# Patient Record
Sex: Female | Born: 1975 | Hispanic: No | Marital: Married | Smoking: Never smoker
Health system: Southern US, Community
[De-identification: ages and names within clinical notes are randomized; demographics above are authoritative.]

## PROBLEM LIST (undated history)

## (undated) ENCOUNTER — Inpatient Hospital Stay (HOSPITAL_COMMUNITY): Payer: Self-pay

## (undated) DIAGNOSIS — F32A Depression, unspecified: Secondary | ICD-10-CM

## (undated) DIAGNOSIS — F329 Major depressive disorder, single episode, unspecified: Secondary | ICD-10-CM

## (undated) DIAGNOSIS — E039 Hypothyroidism, unspecified: Secondary | ICD-10-CM

## (undated) DIAGNOSIS — E785 Hyperlipidemia, unspecified: Secondary | ICD-10-CM

## (undated) DIAGNOSIS — Z3490 Encounter for supervision of normal pregnancy, unspecified, unspecified trimester: Secondary | ICD-10-CM

## (undated) DIAGNOSIS — G43909 Migraine, unspecified, not intractable, without status migrainosus: Secondary | ICD-10-CM

## (undated) DIAGNOSIS — F419 Anxiety disorder, unspecified: Secondary | ICD-10-CM

## (undated) HISTORY — DX: Major depressive disorder, single episode, unspecified: F32.9

## (undated) HISTORY — DX: Anxiety disorder, unspecified: F41.9

## (undated) HISTORY — DX: Depression, unspecified: F32.A

## (undated) HISTORY — DX: Migraine, unspecified, not intractable, without status migrainosus: G43.909

## (undated) HISTORY — DX: Hypothyroidism, unspecified: E03.9

## (undated) HISTORY — DX: Hyperlipidemia, unspecified: E78.5

---

## 2007-04-16 DIAGNOSIS — E039 Hypothyroidism, unspecified: Secondary | ICD-10-CM

## 2007-04-16 HISTORY — DX: Hypothyroidism, unspecified: E03.9

## 2008-08-21 ENCOUNTER — Emergency Department (HOSPITAL_COMMUNITY): Admission: EM | Admit: 2008-08-21 | Discharge: 2008-08-21 | Payer: Self-pay | Admitting: Emergency Medicine

## 2010-07-24 LAB — POCT I-STAT, CHEM 8
HCT: 48 % — ABNORMAL HIGH (ref 36.0–46.0)
Hemoglobin: 16.3 g/dL — ABNORMAL HIGH (ref 12.0–15.0)
Potassium: 4 mEq/L (ref 3.5–5.1)
Sodium: 140 mEq/L (ref 135–145)

## 2010-07-24 LAB — TSH: TSH: 8.668 u[IU]/mL — ABNORMAL HIGH (ref 0.350–4.500)

## 2010-07-24 LAB — POCT PREGNANCY, URINE: Preg Test, Ur: NEGATIVE

## 2011-01-10 ENCOUNTER — Inpatient Hospital Stay (INDEPENDENT_AMBULATORY_CARE_PROVIDER_SITE_OTHER)
Admission: RE | Admit: 2011-01-10 | Discharge: 2011-01-10 | Disposition: A | Discharge status: Home / Self Care | Payer: Self-pay | Source: Ambulatory Visit | Attending: Family Medicine | Admitting: Family Medicine

## 2011-01-10 DIAGNOSIS — S0510XA Contusion of eyeball and orbital tissues, unspecified eye, initial encounter: Secondary | ICD-10-CM

## 2011-01-11 ENCOUNTER — Emergency Department (HOSPITAL_COMMUNITY)
Admission: EM | Admit: 2011-01-11 | Discharge: 2011-01-12 | Disposition: A | Discharge status: Home / Self Care | Payer: Self-pay | Attending: Emergency Medicine | Admitting: Emergency Medicine

## 2011-01-11 DIAGNOSIS — N390 Urinary tract infection, site not specified: Secondary | ICD-10-CM | POA: Insufficient documentation

## 2011-01-11 DIAGNOSIS — R300 Dysuria: Secondary | ICD-10-CM | POA: Insufficient documentation

## 2011-01-11 DIAGNOSIS — Z79899 Other long term (current) drug therapy: Secondary | ICD-10-CM | POA: Insufficient documentation

## 2011-01-11 LAB — URINALYSIS, ROUTINE W REFLEX MICROSCOPIC
Protein, ur: NEGATIVE mg/dL
Urobilinogen, UA: 0.2 mg/dL (ref 0.0–1.0)

## 2011-01-11 LAB — URINE MICROSCOPIC-ADD ON

## 2011-01-11 LAB — POCT PREGNANCY, URINE: Preg Test, Ur: NEGATIVE

## 2011-01-13 ENCOUNTER — Emergency Department (HOSPITAL_COMMUNITY)
Admission: EM | Admit: 2011-01-13 | Discharge: 2011-01-13 | Disposition: A | Discharge status: Home / Self Care | Payer: Self-pay | Attending: Emergency Medicine | Admitting: Emergency Medicine

## 2011-01-13 DIAGNOSIS — S139XXA Sprain of joints and ligaments of unspecified parts of neck, initial encounter: Secondary | ICD-10-CM | POA: Insufficient documentation

## 2011-01-13 DIAGNOSIS — R51 Headache: Secondary | ICD-10-CM | POA: Insufficient documentation

## 2011-01-13 DIAGNOSIS — E039 Hypothyroidism, unspecified: Secondary | ICD-10-CM | POA: Insufficient documentation

## 2011-01-13 DIAGNOSIS — Z79899 Other long term (current) drug therapy: Secondary | ICD-10-CM | POA: Insufficient documentation

## 2011-01-13 DIAGNOSIS — X58XXXA Exposure to other specified factors, initial encounter: Secondary | ICD-10-CM | POA: Insufficient documentation

## 2011-01-13 DIAGNOSIS — I10 Essential (primary) hypertension: Secondary | ICD-10-CM | POA: Insufficient documentation

## 2011-01-13 DIAGNOSIS — M542 Cervicalgia: Secondary | ICD-10-CM | POA: Insufficient documentation

## 2011-01-14 LAB — URINE CULTURE

## 2011-05-06 ENCOUNTER — Encounter (HOSPITAL_COMMUNITY): Payer: Self-pay | Admitting: *Deleted

## 2011-05-06 ENCOUNTER — Emergency Department (INDEPENDENT_AMBULATORY_CARE_PROVIDER_SITE_OTHER)
Admission: EM | Admit: 2011-05-06 | Discharge: 2011-05-06 | Disposition: A | Discharge status: Home / Self Care | Payer: Self-pay | Source: Home / Self Care | Attending: Family Medicine | Admitting: Family Medicine

## 2011-05-06 DIAGNOSIS — M940 Chondrocostal junction syndrome [Tietze]: Secondary | ICD-10-CM

## 2011-05-06 NOTE — ED Notes (Signed)
PT  REPORTS  PAIN  ABOVE      THE   L   BREAST     SHE  REPORTS  IT IS  TENDER  TO PALPATION  SHE  DENYS  ANY   DISCHARGE  FROM   HER  NIPPLES    SHE  DENYS   ANY  HISTORY OF  SIMILAR  EPISODES
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
## 2011-05-07 NOTE — ED Provider Notes (Signed)
Medical screening examination/treatment/procedure(s) were performed by non-physician practitioner and as supervising physician I was immediately available for consultation/collaboration.   Alyssa Mancera DOUGLAS MD.    Darryon Bastin Douglas Quantel Mcinturff, MD 05/07/11 0820 

## 2011-07-17 ENCOUNTER — Encounter (HOSPITAL_COMMUNITY): Payer: Self-pay | Admitting: *Deleted

## 2011-07-17 ENCOUNTER — Emergency Department (HOSPITAL_COMMUNITY)
Admission: EM | Admit: 2011-07-17 | Discharge: 2011-07-17 | Disposition: A | Discharge status: Home / Self Care | Payer: Medicaid Other | Attending: Emergency Medicine | Admitting: Emergency Medicine

## 2011-07-17 DIAGNOSIS — M79646 Pain in unspecified finger(s): Secondary | ICD-10-CM

## 2011-07-17 DIAGNOSIS — W4909XA Other specified item causing external constriction, initial encounter: Secondary | ICD-10-CM | POA: Insufficient documentation

## 2011-07-17 DIAGNOSIS — S60949A Unspecified superficial injury of unspecified finger, initial encounter: Secondary | ICD-10-CM | POA: Insufficient documentation

## 2011-07-17 NOTE — ED Notes (Signed)
Ring stuck on rt ring finger

## 2011-07-17 NOTE — Discharge Instructions (Signed)
Ibuprofen for pain as needed. Keep your hand elevated, ice it for 20 min at a time several times a day. Follow up with your doctor as needed.

## 2011-07-17 NOTE — ED Provider Notes (Signed)
History     CSN: [id number]  Arrival date & time 07/17/11  1737   First MD Initiated Contact with Patient 07/17/11 1739      Chief Complaint  Patient presents with  . Finger Injury    (Consider location/radiation/quality/duration/timing/severity/associated sxs/prior treatment) Patient is a 36 y.o. female presenting with hand pain. The history is provided by the patient.  Hand Pain This is a new problem. The current episode started today. Associated symptoms include a fever and joint swelling.  Pt states she put a ring on her finger, states she was unable to take if off. States her finger swelled and turned "purple." States ring was on the finger for about 2 hrs. No other complaints.   Past Medical History  Diagnosis Date  . Thyroid disease     Past Surgical History  Procedure Date  . Cesarean section     No family history on file.  History  Substance Use Topics  . Smoking status: Never Smoker   . Smokeless tobacco: Not on file  . Alcohol Use: No    OB History    Grav Para Term Preterm Abortions TAB SAB Ect Mult Living                  Review of Systems  Constitutional: Positive for fever.  HENT: Negative.   Musculoskeletal: Positive for joint swelling.  All other systems reviewed and are negative.    Allergies  Review of patient's allergies indicates no known allergies.  Home Medications   Current Outpatient Rx  Name Route Sig Dispense Refill  . LEVOTHYROXINE SODIUM 75 MCG PO TABS Oral Take 75 mcg by mouth daily.      BP 154/74  Pulse 105  Temp(Src) 97.4 F (36.3 C) (Oral)  Resp 18  Wt 126 lb (57.153 kg)  SpO2 100%  LMP 06/19/2011  Physical Exam  Nursing note and vitals reviewed. Constitutional: She is oriented to person, place, and time. She appears well-developed and well-nourished.  HENT:  Head: Normocephalic.  Cardiovascular: Normal rate, regular rhythm and normal heart sounds.   Pulmonary/Chest: Effort normal and breath sounds normal.  No respiratory distress.  Musculoskeletal:       Right ring finger has some redness over the proximal phalanx where ring was. No significant swelling. Finger is pink, good cap refill to the tip <2sec. Warm. Good sensation. Full rom of entire finger.   Neurological: She is alert and oriented to person, place, and time.  Skin: Skin is warm and dry.  Psychiatric: She has a normal mood and affect.    ED Course  Procedures (including critical care time)  Ring was removed by an ortho tech. Pt's finger appears to be normal. Full ROM of all joints. No swelling, no skin abrasions or cuts. Good cap refill to the tip. Will d/c home.   No diagnosis found.    MDM          Lottie Mussel, PA 07/17/11 1851

## 2011-07-17 NOTE — ED Provider Notes (Signed)
Medical screening examination/treatment/procedure(s) were performed by non-physician practitioner and as supervising physician I was immediately available for consultation/collaboration.   Dayton Bailiff, MD 07/17/11 2100

## 2011-08-04 ENCOUNTER — Emergency Department (HOSPITAL_COMMUNITY): Payer: Medicaid Other

## 2011-08-04 ENCOUNTER — Encounter (HOSPITAL_COMMUNITY): Payer: Self-pay | Admitting: *Deleted

## 2011-08-04 ENCOUNTER — Emergency Department (HOSPITAL_COMMUNITY)
Admission: EM | Admit: 2011-08-04 | Discharge: 2011-08-04 | Disposition: A | Discharge status: Home / Self Care | Payer: Medicaid Other | Attending: Emergency Medicine | Admitting: Emergency Medicine

## 2011-08-04 DIAGNOSIS — Z3201 Encounter for pregnancy test, result positive: Secondary | ICD-10-CM | POA: Insufficient documentation

## 2011-08-04 DIAGNOSIS — M545 Low back pain, unspecified: Secondary | ICD-10-CM | POA: Insufficient documentation

## 2011-08-04 DIAGNOSIS — O2690 Pregnancy related conditions, unspecified, unspecified trimester: Secondary | ICD-10-CM

## 2011-08-04 DIAGNOSIS — R109 Unspecified abdominal pain: Secondary | ICD-10-CM | POA: Insufficient documentation

## 2011-08-04 LAB — URINALYSIS, ROUTINE W REFLEX MICROSCOPIC
Hgb urine dipstick: NEGATIVE
Ketones, ur: NEGATIVE mg/dL
Protein, ur: NEGATIVE mg/dL
Urobilinogen, UA: 0.2 mg/dL (ref 0.0–1.0)

## 2011-08-04 LAB — BASIC METABOLIC PANEL
BUN: 8 mg/dL (ref 6–23)
CO2: 24 mEq/L (ref 19–32)
Calcium: 9.3 mg/dL (ref 8.4–10.5)
Chloride: 103 mEq/L (ref 96–112)
Creatinine, Ser: 0.56 mg/dL (ref 0.50–1.10)
GFR calc Af Amer: >90 mL/min (ref >90)
GFR calc non Af Amer: >90 mL/min (ref >90)
Glucose, Bld: 102 mg/dL — ABNORMAL HIGH (ref 70–99)
Potassium: 3.3 mEq/L — ABNORMAL LOW (ref 3.5–5.1)
Sodium: 137 mEq/L (ref 135–145)

## 2011-08-04 LAB — CBC
HCT: 44.2 % (ref 36.0–46.0)
Hemoglobin: 15.5 g/dL — ABNORMAL HIGH (ref 12.0–15.0)
MCH: 30 pg (ref 26.0–34.0)
MCHC: 35.1 g/dL (ref 30.0–36.0)
MCV: 85.7 fL (ref 78.0–100.0)
Platelets: 171 10*3/uL (ref 150–400)
RBC: 5.16 MIL/uL — ABNORMAL HIGH (ref 3.87–5.11)
RDW: 13.8 % (ref 11.5–15.5)
WBC: 8.1 10*3/uL (ref 4.0–10.5)

## 2011-08-04 LAB — HCG, QUANTITATIVE, PREGNANCY: hCG, Beta Chain, Quant, S: 5591 m[IU]/mL — ABNORMAL HIGH (ref <5)

## 2011-08-04 LAB — POCT PREGNANCY, URINE: Preg Test, Ur: POSITIVE — AB

## 2011-08-04 NOTE — ED Provider Notes (Signed)
History     CSN: [id number]  Arrival date & time 08/04/11  1335   First MD Initiated Contact with Patient 08/04/11 1406      Chief Complaint  Patient presents with  . Abdominal Pain    (Consider location/radiation/quality/duration/timing/severity/associated sxs/prior treatment) HPI She presents to emergency department with mild lower pelvic pain.  Says the pain began yesterday.  The patient states that she may be pregnant but pregnancy test at home showed a faint pink line.  She states that her lower back is also having some discomfort as well.  Patient, states she has had some mild cramping, like she may be having a period, but no bleeding vaginally or vaginal discharge.  She denies nausea/vomiting, chest pain, shortness of breath, headache, visual changes, dizziness, syncope, or fevers.  Patient states nothing seems to make the pain better or worse.  She states that the discomfort is very mild.  Patient, states she is very anxious to find out that everything is okay because she said her pregnancy last year and the baby died shortly after birth.  Past Medical History  Diagnosis Date  . Thyroid disease     Past Surgical History  Procedure Date  . Cesarean section     History reviewed. No pertinent family history.  History  Substance Use Topics  . Smoking status: Never Smoker   . Smokeless tobacco: Not on file  . Alcohol Use: No    OB History    Grav Para Term Preterm Abortions TAB SAB Ect Mult Living                  Review of Systems All pertinent positives and negatives reviewed in the history of present illness  Allergies  Review of patient's allergies indicates no known allergies.  Home Medications   Current Outpatient Rx  Name Route Sig Dispense Refill  . LEVOTHYROXINE SODIUM 100 MCG PO TABS Oral Take 100 mcg by mouth daily.      BP 135/71  Pulse 121  Temp 97.4 F (36.3 C)  Resp 20  SpO2 100%  LMP 06/20/2011  Physical Exam Physical Examination:  General appearance - alert, well appearing, and in no distress and oriented to person, place, and time Mental status - alert, oriented to person, place, and time Eyes - pupils equal and reactive, extraocular eye movements intact Ears - bilateral TM's and external ear canals normal Nose - normal and patent, no erythema, discharge or polyps Mouth - mucous membranes moist, pharynx normal without lesions Chest - clear to auscultation, no wheezes, rales or rhonchi, symmetric air entry Heart - normal rate, regular rhythm, normal S1, S2, no murmurs, rubs, clicks or gallops Abdomen - no rebound tenderness noted bowel sounds normal no bladder distension noted SOFT but there is some mild tenderness in the suprapubic region Pelvic - normal external genitalia, vulva, vagina, cervix, uterus and adnexa, exam chaperoned by female tech  ED Course  Procedures (including critical care time)  Labs Reviewed  CBC - Abnormal; Notable for the following:    RBC 5.16 (*)    Hemoglobin 15.5 (*)    All other components within normal limits  BASIC METABOLIC PANEL - Abnormal; Notable for the following:    Potassium 3.3 (*)    Glucose, Bld 102 (*)    All other components within normal limits  POCT PREGNANCY, URINE - Abnormal; Notable for the following:    Preg Test, Ur POSITIVE (*)    All other components within normal limits  HCG, QUANTITATIVE, PREGNANCY - Abnormal; Notable for the following:    hCG, Beta Chain, Quant, S 5591 (*)    All other components within normal limits  URINALYSIS, ROUTINE W REFLEX MICROSCOPIC    Filed Vitals:   08/04/11 1341  BP: 135/71  Pulse: 121  Temp: 97.4 F (36.3 C)  Resp: 20  SpO2: 100%    MDM          Carlyle Dolly, PA-C 08/04/11 1625

## 2011-08-04 NOTE — Discharge Instructions (Signed)
Ultrasound shows that you have a fetus that is in the right location.  No other abnormal finding were noted here today.  Please follow up with an obstetrician at [facility] in the next 7-14 days to have a follow up ultrasound perform and to reevaluate you.  Return to ER if you have any other concerns.    ABCs of Pregnancy A Antepartum care is very important. Be sure you see your doctor and get prenatal care as soon as you think you are pregnant. At this time, you will be tested for infection, genetic abnormalities and potential problems with you and the pregnancy. This is the time to discuss diet, exercise, work, medications, labor, pain medication during labor and the possibility of a cesarean delivery. Ask any questions that may concern you. It is important to see your doctor regularly throughout your pregnancy. Avoid exposure to toxic substances and chemicals - such as cleaning solvents, lead and mercury, some insecticides, and paint. Pregnant women should avoid exposure to paint fumes, and fumes that cause you to feel ill, dizzy or faint. When possible, it is a good idea to have a pre-pregnancy consultation with your caregiver to begin some important recommendations your caregiver suggests such as, taking folic acid, exercising, quitting smoking, avoiding alcoholic beverages, etc. B Breastfeeding is the healthiest choice for both you and your baby. It has many nutritional benefits for the baby and health benefits for the mother. It also creates a very tight and loving bond between the baby and mother. Talk to your doctor, your family and friends, and your employer about how you choose to feed your baby and how they can support you in your decision. Not all birth defects can be prevented, but a woman can take actions that may increase her chance of having a healthy baby. Many birth defects happen very early in pregnancy, sometimes before a woman even knows she is pregnant. Birth defects or abnormalities  of any child in your or the father's family should be discussed with your caregiver. Get a good support bra as your breast size changes. Wear it especially when you exercise and when nursing.  C Celebrate the news of your pregnancy with the your spouse/father and family. Childbirth classes are helpful to take for you and the spouse/father because it helps to understand what happens during the pregnancy, labor and delivery. Cesarean delivery should be discussed with your doctor so you are prepared for that possibility. The pros and cons of circumcision if it is a boy, should be discussed with your pediatrician. Cigarette smoking during pregnancy can result in low birth weight babies. It has been associated with infertility, miscarriages, tubal pregnancies, infant death (mortality) and poor health (morbidity) in childhood. Additionally, cigarette smoking may cause long-term learning disabilities. If you smoke, you should try to quit before getting pregnant and not smoke during the pregnancy. Secondary smoke may also harm a mother and her developing baby. It is a good idea to ask people to stop smoking around you during your pregnancy and after the baby is born. Extra calcium is necessary when you are pregnant and is found in your prenatal vitamin, in dairy products, green leafy vegetables and in calcium supplements. D A healthy diet according to your current weight and height, along with vitamins and mineral supplements should be discussed with your caregiver. Domestic abuse or violence should be made known to your doctor right away to get the situation corrected. Drink more water when you exercise to keep hydrated. Discomfort  of your back and legs usually develops and progresses from the middle of the second trimester through to delivery of the baby. This is because of the enlarging baby and uterus, which may also affect your balance. Do not take illegal drugs. Illegal drugs can seriously harm the baby and you.  Drink extra fluids (water is best) throughout pregnancy to help your body keep up with the increases in your blood volume. Drink at least 6 to 8 glasses of water, fruit juice, or milk each day. A good way to know you are drinking enough fluid is when your urine looks almost like clear water or is very light yellow.  E Eat healthy to get the nutrients you and your unborn baby need. Your meals should include the five basic food groups. Exercise (30 minutes of light to moderate exercise a day) is important and encouraged during pregnancy, if there are no medical problems or problems with the pregnancy. Exercise that causes discomfort or dizziness should be stopped and reported to your caregiver. Emotions during pregnancy can change from being ecstatic to depression and should be understood by you, your partner and your family. F Fetal screening with ultrasound, amniocentesis and monitoring during pregnancy and labor is common and sometimes necessary. Take 400 micrograms of folic acid daily both before, when possible, and during the first few months of pregnancy to reduce the risk of birth defects of the brain and spine. All women who could possibly become pregnant should take a vitamin with folic acid, every day. It is also important to eat a healthy diet with fortified foods (enriched grain products, including cereals, rice, breads, and pastas) and foods with natural sources of folate (orange juice, green leafy vegetables, beans, peanuts, broccoli, asparagus, peas, and lentils). The father should be involved with all aspects of the pregnancy including, the prenatal care, childbirth classes, labor, delivery, and postpartum time. Fathers may also have emotional concerns about being a father, financial needs, and raising a family. G Genetic testing should be done appropriately. It is important to know your family and the father's history. If there have been problems with pregnancies or birth defects in your family,  report these to your doctor. Also, genetic counselors can talk with you about the information you might need in making decisions about having a family. You can call a major medical center in your area for help in finding a board-certified genetic counselor. Genetic testing and counseling should be done before pregnancy when possible, especially if there is a history of problems in the mother's or father's family. Certain ethnic backgrounds are more at risk for genetic defects. H Get familiar with the hospital where you will be having your baby. Get to know how long it takes to get there, the labor and delivery area, and the hospital procedures. Be sure your medical insurance is accepted there. Get your home ready for the baby including, clothes, the baby's room (when possible), furniture and car seat. Hand washing is important throughout the day, especially after handling raw meat and poultry, changing the baby's diaper or using the bathroom. This can help prevent the spread of many bacteria and viruses that cause infection. Your hair may become dry and thinner, but will return to normal a few weeks after the baby is born. Heartburn is a common problem that can be treated by taking antacids recommended by your caregiver, eating smaller meals 5 or 6 times a day, not drinking liquids when eating, drinking between meals and raising the head  of your bed 2 to 3 inches. I Insurance to cover you, the baby, doctor and hospital should be reviewed so that you will be prepared to pay any costs not covered by your insurance plan. If you do not have medical insurance, there are usually clinics and services available for you in your community. Take 30 milligrams of iron during your pregnancy as prescribed by your doctor to reduce the risk of low red blood cells (anemia) later in pregnancy. All women of childbearing age should eat a diet rich in iron. J There should be a joint effort for the mother, father and any other  children to adapt to the pregnancy financially, emotionally, and psychologically during the pregnancy. Join a support group for moms-to-be. Or, join a class on parenting or childbirth. Have the family participate when possible. K Know your limits. Let your caregiver know if you experience any of the following:   Pain of any kind.   Strong cramps.   You develop a lot of weight in a short period of time (5 pounds in 3 to 5 days).   Vaginal bleeding, leaking of amniotic fluid.   Headache, vision problems.   Dizziness, fainting, shortness of breath.   Chest pain.   Fever of 102 F (38.9 C) or higher.   Gush of clear fluid from your vagina.   Painful urination.   Domestic violence.   Irregular heartbeat (palpitations).   Rapid beating of the heart (tachycardia).   Constant feeling sick to your stomach (nauseous) and vomiting.   Trouble walking, fluid retention (edema).   Muscle weakness.   If your baby has decreased activity.   Persistent diarrhea.   Abnormal vaginal discharge.   Uterine contractions at 20-minute intervals.   Back pain that travels down your leg.  L Learn and practice that what you eat and drink should be in moderation and healthy for you and your baby. Legal drugs such as alcohol and caffeine are important issues for pregnant women. There is no safe amount of alcohol a woman can drink while pregnant. Fetal alcohol syndrome, a disorder characterized by growth retardation, facial abnormalities, and central nervous system dysfunction, is caused by a woman's use of alcohol during pregnancy. Caffeine, found in tea, coffee, soft drinks and chocolate, should also be limited. Be sure to read labels when trying to cut down on caffeine during pregnancy. More than 200 foods, beverages, and over-the-counter medications contain caffeine and have a high salt content! There are coffees and teas that do not contain caffeine. M Medical conditions such as diabetes,  epilepsy, and high blood pressure should be treated and kept under control before pregnancy when possible, but especially during pregnancy. Ask your caregiver about any medications that may need to be changed or adjusted during pregnancy. If you are currently taking any medications, ask your caregiver if it is safe to take them while you are pregnant or before getting pregnant when possible. Also, be sure to discuss any herbs or vitamins you are taking. They are medicines, too! Discuss with your doctor all medications, prescribed and over-the-counter, that you are taking. During your prenatal visit, discuss the medications your doctor may give you during labor and delivery. N Never be afraid to ask your doctor or caregiver questions about your health, the progress of the pregnancy, family problems, stressful situations, and recommendation for a pediatrician, if you do not have one. It is better to take all precautions and discuss any questions or concerns you may have during your  office visits. It is a good idea to write down your questions before you visit the doctor. O Over-the-counter cough and cold remedies may contain alcohol or other ingredients that should be avoided during pregnancy. Ask your caregiver about prescription, herbs or over-the-counter medications that you are taking or may consider taking while pregnant.  P Physical activity during pregnancy can benefit both you and your baby by lessening discomfort and fatigue, providing a sense of well-being, and increasing the likelihood of early recovery after delivery. Light to moderate exercise during pregnancy strengthens the belly (abdominal) and back muscles. This helps improve posture. Practicing yoga, walking, swimming, and cycling on a stationary bicycle are usually safe exercises for pregnant women. Avoid scuba diving, exercise at high altitudes (over 3000 feet), skiing, horseback riding, contact sports, etc. Always check with your doctor  before beginning any kind of exercise, especially during pregnancy and especially if you did not exercise before getting pregnant. Q Queasiness, stomach upset and morning sickness are common during pregnancy. Eating a couple of crackers or dry toast before getting out of bed. Foods that you normally love may make you feel sick to your stomach. You may need to substitute other nutritious foods. Eating 5 or 6 small meals a day instead of 3 large ones may make you feel better. Do not drink with your meals, drink between meals. Questions that you have should be written down and asked during your prenatal visits. R Read about and make plans to baby-proof your home. There are important tips for making your home a safer environment for your baby. Review the tips and make your home safer for you and your baby. Read food labels regarding calories, salt and fat content in the food. S Saunas, hot tubs, and steam rooms should be avoided while you are pregnant. Excessive high heat may be harmful during your pregnancy. Your caregiver will screen and examine you for sexually transmitted diseases and genetic disorders during your prenatal visits. Learn the signs of labor. Sexual relations while pregnant is safe unless there is a medical or pregnancy problem and your caregiver advises against it. T Traveling long distances should be avoided especially in the third trimester of your pregnancy. If you do have to travel out of state, be sure to take a copy of your medical records and medical insurance plan with you. You should not travel long distances without seeing your doctor first. Most airlines will not allow you to travel after 36 weeks of pregnancy. Toxoplasmosis is an infection caused by a parasite that can seriously harm an unborn baby. Avoid eating undercooked meat and handling cat litter. Be sure to wear gloves when gardening. Tingling of the hands and fingers is not unusual and is due to fluid retention. This will  go away after the baby is born. U Womb (uterus) size increases during the first trimester. Your kidneys will begin to function more efficiently. This may cause you to feel the need to urinate more often. You may also leak urine when sneezing, coughing or laughing. This is due to the growing uterus pressing against your bladder, which lies directly in front of and slightly under the uterus during the first few months of pregnancy. If you experience burning along with frequency of urination or bloody urine, be sure to tell your doctor. The size of your uterus in the third trimester may cause a problem with your balance. It is advisable to maintain good posture and avoid wearing high heels during this time. An ultrasound  of your baby may be necessary during your pregnancy and is safe for you and your baby. V Vaccinations are an important concern for pregnant women. Get needed vaccines before pregnancy. Center for Disease Control ([employer]) has clear guidelines for the use of vaccines during pregnancy. Review the list, be sure to discuss it with your doctor. Prenatal vitamins are helpful and healthy for you and the baby. Do not take extra vitamins except what is recommended. Taking too much of certain vitamins can cause overdose problems. Continuous vomiting should be reported to your caregiver. Varicose veins may appear especially if there is a family history of varicose veins. They should subside after the delivery of the baby. Support hose helps if there is leg discomfort. W Being overweight or underweight during pregnancy may cause problems. Try to get within 15 pounds of your ideal weight before pregnancy. Remember, pregnancy is not a time to be dieting! Do not stop eating or start skipping meals as your weight increases. Both you and your baby need the calories and nutrition you receive from a healthy diet. Be sure to consult with your doctor about your diet. There is a formula and diet plan available  depending on whether you are overweight or underweight. Your caregiver or nutritionist can help and advise you if necessary. X Avoid X-rays. If you must have dental work or diagnostic tests, tell your dentist or physician that you are pregnant so that extra care can be taken. X-rays should only be taken when the risks of not taking them outweigh the risk of taking them. If needed, only the minimum amount of radiation should be used. When X-rays are necessary, protective lead shields should be used to cover areas of the body that are not being X-rayed. Y Your baby loves you. Breastfeeding your baby creates a loving and very close bond between the two of you. Give your baby a healthy environment to live in while you are pregnant. Infants and children require constant care and guidance. Their health and safety should be carefully watched at all times. After the baby is born, rest or take a nap when the baby is sleeping. Z Get your ZZZs. Be sure to get plenty of rest. Resting on your side as often as possible, especially on your left side is advised. It provides the best circulation to your baby and helps reduce swelling. Try taking a nap for 30 to 45 minutes in the afternoon when possible. After the baby is born rest or take a nap when the baby is sleeping. Try elevating your feet for that amount of time when possible. It helps the circulation in your legs and helps reduce swelling.  Most information courtesy of the CDC. Document Released: 04/01/2005 Document Revised: 03/21/2011 Document Reviewed: 12/14/2008 [facility] Patient Information 2012 [place], [place].

## 2011-08-04 NOTE — ED Provider Notes (Signed)
Assumed care @ 1624.  G3P1 pregnant pt with intermittent suprapubic abd pain requesting further eval.  [place] ordered.  Does have GYN whom she is amendable with plan.  No vaginal bleeding, discharge, and pelvic exam is unremarkable according to [facility], PA-C.  Plan to d/c if [place] is unremarkable.    5:20 PM US shows an intrauterine gestational sac, too early to date.  Recommend f/u US in 7-14 days.  Pt did not have a pelvic exam performed yet.  My pelvic examination were unremarkable. No chanderlier sign, cervical os closed, mild discharge noted.  Culture sent, wet prep sent.    6:20 PM Wet prep is unremarkable.  Will d/c with f/u instruction.  Pt does have appointment for OBGYN.  Pt does have mildly elevated HR due to worries of her fetal's health.  Reassurance given.  Pt much calmer now.    Results for orders placed during the hospital encounter of 08/04/11  URINALYSIS, ROUTINE W REFLEX MICROSCOPIC      Component Value Range   Color, Urine YELLOW  YELLOW    APPearance CLEAR  CLEAR    Specific Gravity, Urine 1.005  1.005 - 1.030    pH 7.0  5.0 - 8.0    Glucose, UA NEGATIVE  NEGATIVE (mg/dL)   Hgb urine dipstick NEGATIVE  NEGATIVE    Bilirubin Urine NEGATIVE  NEGATIVE    Ketones, ur NEGATIVE  NEGATIVE (mg/dL)   Protein, ur NEGATIVE  NEGATIVE (mg/dL)   Urobilinogen, UA 0.2  0.0 - 1.0 (mg/dL)   Nitrite NEGATIVE  NEGATIVE    Leukocytes, UA NEGATIVE  NEGATIVE   CBC      Component Value Range   WBC 8.1  4.0 - 10.5 (K/uL)   RBC 5.16 (*) 3.87 - 5.11 (MIL/uL)   Hemoglobin 15.5 (*) 12.0 - 15.0 (g/dL)   HCT 16.1  09.6 - 04.5 (%)   MCV 85.7  78.0 - 100.0 (fL)   MCH 30.0  26.0 - 34.0 (pg)   MCHC 35.1  30.0 - 36.0 (g/dL)   RDW 40.9  81.1 - 91.4 (%)   Platelets 171  150 - 400 (K/uL)  BASIC METABOLIC PANEL      Component Value Range   Sodium 137  135 - 145 (mEq/L)   Potassium 3.3 (*) 3.5 - 5.1 (mEq/L)   Chloride 103  96 - 112 (mEq/L)   CO2 24  19 - 32 (mEq/L)   Glucose, Bld 102 (*) 70 - 99  (mg/dL)   BUN 8  6 - 23 (mg/dL)   Creatinine, Ser 7.82  0.50 - 1.10 (mg/dL)   Calcium 9.3  8.4 - 95.6 (mg/dL)   GFR calc non Af Amer >90  >90 (mL/min)   GFR calc Af Amer >90  >90 (mL/min)  POCT PREGNANCY, URINE      Component Value Range   Preg Test, Ur POSITIVE (*) NEGATIVE   HCG, QUANTITATIVE, PREGNANCY      Component Value Range   hCG, Beta Chain, Quant, S 5591 (*) <5 (mIU/mL)  WET PREP, GENITAL      Component Value Range   Yeast Wet Prep HPF POC NONE SEEN  NONE SEEN    Trich, Wet Prep NONE SEEN  NONE SEEN    Clue Cells Wet Prep HPF POC NONE SEEN  NONE SEEN    WBC, Wet Prep HPF POC NONE SEEN  NONE SEEN    US Ob Comp Less 14 Wks  08/04/2011  *RADIOLOGY REPORT*  Clinical Data: Pregnancy.  Pain.  Gravida 3, para 1.  Quantitative beta HCG is 5591.  By LMP, the patient is 6 weeks 3 days.  EDC by LMP is 03/26/2012.  OBSTETRIC <14 WK ULTRASOUND  Technique:  Transabdominal ultrasound was performed for evaluation of the gestation as well as the maternal uterus and adnexal regions.  None Intrauterine gestational sac: Present Yolk sac: Present Embryo: Not visualized Cardiac Activity: Not visualized  MSD: 8.0 mm  5w  4d [place] EDC: 04/01/2012  Maternal uterus/Adnexae: The left ovary has a normal appearance.  Right ovary has a normal appearance and contains a corpus luteum cyst.  IMPRESSION:  1.  Intrauterine gestational sac with yolk sac.  No embryo identified at this point. 2.  Follow-up ultrasound is suggested in 10-14 days to assess for presence of fetal pole and for dating purposes.  Original Report Authenticated By: Patterson Hammersmith, M.D.   US Ob Transvaginal  08/04/2011  *RADIOLOGY REPORT*  Clinical Data: Pregnancy.  Pain.  Gravida 3, para 1.  Quantitative beta HCG is 5591.  By LMP, the patient is 6 weeks 3 days.  EDC by LMP is 03/26/2012.  OBSTETRIC <14 WK ULTRASOUND  Technique:  Transabdominal ultrasound was performed for evaluation of the gestation as well as the maternal uterus and adnexal  regions.  None Intrauterine gestational sac: Present Yolk sac: Present Embryo: Not visualized Cardiac Activity: Not visualized  MSD: 8.0 mm  5w  4d [place] EDC: 04/01/2012  Maternal uterus/Adnexae: The left ovary has a normal appearance.  Right ovary has a normal appearance and contains a corpus luteum cyst.  IMPRESSION:  1.  Intrauterine gestational sac with yolk sac.  No embryo identified at this point. 2.  Follow-up ultrasound is suggested in 10-14 days to assess for presence of fetal pole and for dating purposes.  Original Report Authenticated By: Patterson Hammersmith, M.D.      Fayrene Helper, PA-C 08/04/11 1821

## 2011-08-04 NOTE — ED Notes (Signed)
Reports having lower abd pain, cramping and lower back pain, thinks she may be pregnant, lmp 3/7. Denies any vaginal discharge or bleeding.

## 2011-08-05 NOTE — ED Provider Notes (Signed)
Medical screening examination/treatment/procedure(s) were performed by non-physician practitioner and as supervising physician I was immediately available for consultation/collaboration.  Geoffery Lyons, MD 08/05/11 [phone]

## 2011-08-05 NOTE — ED Provider Notes (Signed)
Medical screening examination/treatment/procedure(s) were performed by non-physician practitioner and as supervising physician I was immediately available for consultation/collaboration.   Carleene Cooper III, MD 08/05/11 [phone]

## 2011-08-06 LAB — GC/CHLAMYDIA PROBE AMP, GENITAL
Chlamydia, DNA Probe: NEGATIVE
GC Probe Amp, Genital: NEGATIVE

## 2011-08-14 ENCOUNTER — Encounter: Payer: Self-pay | Admitting: Obstetrics and Gynecology

## 2011-08-15 ENCOUNTER — Encounter: Payer: Self-pay | Admitting: Obstetrics and Gynecology

## 2011-08-15 ENCOUNTER — Ambulatory Visit (INDEPENDENT_AMBULATORY_CARE_PROVIDER_SITE_OTHER): Payer: Self-pay

## 2011-08-15 ENCOUNTER — Ambulatory Visit (INDEPENDENT_AMBULATORY_CARE_PROVIDER_SITE_OTHER): Payer: Medicaid Other | Admitting: Obstetrics and Gynecology

## 2011-08-15 VITALS — BP 120/70 | Ht 59.75 in | Wt 125.0 lb

## 2011-08-15 DIAGNOSIS — O3680X0 Pregnancy with inconclusive fetal viability, not applicable or unspecified: Secondary | ICD-10-CM

## 2011-08-15 DIAGNOSIS — IMO0002 Reserved for concepts with insufficient information to code with codable children: Secondary | ICD-10-CM

## 2011-08-15 DIAGNOSIS — Z3200 Encounter for pregnancy test, result unknown: Secondary | ICD-10-CM

## 2011-08-15 DIAGNOSIS — N912 Amenorrhea, unspecified: Secondary | ICD-10-CM

## 2011-08-15 LAB — US OB LIMITED

## 2011-08-15 MED ORDER — PRENATAL VITAMINS (DIS) PO TABS: 1.0000 | ORAL_TABLET | Freq: Every day | ORAL | Status: DC

## 2011-08-15 NOTE — Patient Instructions (Signed)
ABCs of Pregnancy A Antepartum care is very important. Be sure you see your doctor and get prenatal care as soon as you think you are pregnant. At this time, you will be tested for infection, genetic abnormalities and potential problems with you and the pregnancy. This is the time to discuss diet, exercise, work, medications, labor, pain medication during labor and the possibility of a cesarean delivery. Ask any questions that may concern you. It is important to see your doctor regularly throughout your pregnancy. Avoid exposure to toxic substances and chemicals - such as cleaning solvents, lead and mercury, some insecticides, and paint. Pregnant women should avoid exposure to paint fumes, and fumes that cause you to feel ill, dizzy or faint. When possible, it is a good idea to have a pre-pregnancy consultation with your caregiver to begin some important recommendations your caregiver suggests such as, taking folic acid, exercising, quitting smoking, avoiding alcoholic beverages, etc. B Breastfeeding is the healthiest choice for both you and your baby. It has many nutritional benefits for the baby and health benefits for the mother. It also creates a very tight and loving bond between the baby and mother. Talk to your doctor, your family and friends, and your employer about how you choose to feed your baby and how they can support you in your decision. Not all birth defects can be prevented, but a woman can take actions that may increase her chance of having a healthy baby. Many birth defects happen very early in pregnancy, sometimes before a woman even knows she is pregnant. Birth defects or abnormalities of any child in your or the father's family should be discussed with your caregiver. Get a good support bra as your breast size changes. Wear it especially when you exercise and when nursing.  C Celebrate the news of your pregnancy with the your spouse/father and family. Childbirth classes are helpful to  take for you and the spouse/father because it helps to understand what happens during the pregnancy, labor and delivery. Cesarean delivery should be discussed with your doctor so you are prepared for that possibility. The pros and cons of circumcision if it is a boy, should be discussed with your pediatrician. Cigarette smoking during pregnancy can result in low birth weight babies. It has been associated with infertility, miscarriages, tubal pregnancies, infant death (mortality) and poor health (morbidity) in childhood. Additionally, cigarette smoking may cause long-term learning disabilities. If you smoke, you should try to quit before getting pregnant and not smoke during the pregnancy. Secondary smoke may also harm a mother and her developing baby. It is a good idea to ask people to stop smoking around you during your pregnancy and after the baby is born. Extra calcium is necessary when you are pregnant and is found in your prenatal vitamin, in dairy products, green leafy vegetables and in calcium supplements. D A healthy diet according to your current weight and height, along with vitamins and mineral supplements should be discussed with your caregiver. Domestic abuse or violence should be made known to your doctor right away to get the situation corrected. Drink more water when you exercise to keep hydrated. Discomfort of your back and legs usually develops and progresses from the middle of the second trimester through to delivery of the baby. This is because of the enlarging baby and uterus, which may also affect your balance. Do not take illegal drugs. Illegal drugs can seriously harm the baby and you. Drink extra fluids (water is best) throughout pregnancy to help   your body keep up with the increases in your blood volume. Drink at least 6 to 8 glasses of water, fruit juice, or milk each day. A good way to know you are drinking enough fluid is when your urine looks almost like clear water or is very light  yellow.  E Eat healthy to get the nutrients you and your unborn baby need. Your meals should include the five basic food groups. Exercise (30 minutes of light to moderate exercise a day) is important and encouraged during pregnancy, if there are no medical problems or problems with the pregnancy. Exercise that causes discomfort or dizziness should be stopped and reported to your caregiver. Emotions during pregnancy can change from being ecstatic to depression and should be understood by you, your partner and your family. F Fetal screening with ultrasound, amniocentesis and monitoring during pregnancy and labor is common and sometimes necessary. Take 400 micrograms of folic acid daily both before, when possible, and during the first few months of pregnancy to reduce the risk of birth defects of the brain and spine. All women who could possibly become pregnant should take a vitamin with folic acid, every day. It is also important to eat a healthy diet with fortified foods (enriched grain products, including cereals, rice, breads, and pastas) and foods with natural sources of folate (orange juice, green leafy vegetables, beans, peanuts, broccoli, asparagus, peas, and lentils). The father should be involved with all aspects of the pregnancy including, the prenatal care, childbirth classes, labor, delivery, and postpartum time. Fathers may also have emotional concerns about being a father, financial needs, and raising a family. G Genetic testing should be done appropriately. It is important to know your family and the father's history. If there have been problems with pregnancies or birth defects in your family, report these to your doctor. Also, genetic counselors can talk with you about the information you might need in making decisions about having a family. You can call a major medical center in your area for help in finding a board-certified genetic counselor. Genetic testing and counseling should be done  before pregnancy when possible, especially if there is a history of problems in the mother's or father's family. Certain ethnic backgrounds are more at risk for genetic defects. H Get familiar with the hospital where you will be having your baby. Get to know how long it takes to get there, the labor and delivery area, and the hospital procedures. Be sure your medical insurance is accepted there. Get your home ready for the baby including, clothes, the baby's room (when possible), furniture and car seat. Hand washing is important throughout the day, especially after handling raw meat and poultry, changing the baby's diaper or using the bathroom. This can help prevent the spread of many bacteria and viruses that cause infection. Your hair may become dry and thinner, but will return to normal a few weeks after the baby is born. Heartburn is a common problem that can be treated by taking antacids recommended by your caregiver, eating smaller meals 5 or 6 times a day, not drinking liquids when eating, drinking between meals and raising the head of your bed 2 to 3 inches. I Insurance to cover you, the baby, doctor and hospital should be reviewed so that you will be prepared to pay any costs not covered by your insurance plan. If you do not have medical insurance, there are usually clinics and services available for you in your community. Take 30 milligrams of iron during   your pregnancy as prescribed by your doctor to reduce the risk of low red blood cells (anemia) later in pregnancy. All women of childbearing age should eat a diet rich in iron. J There should be a joint effort for the mother, father and any other children to adapt to the pregnancy financially, emotionally, and psychologically during the pregnancy. Join a support group for moms-to-be. Or, join a class on parenting or childbirth. Have the family participate when possible. K Know your limits. Let your caregiver know if you experience any of the  following:   Pain of any kind.   Strong cramps.   You develop a lot of weight in a short period of time (5 pounds in 3 to 5 days).   Vaginal bleeding, leaking of amniotic fluid.   Headache, vision problems.   Dizziness, fainting, shortness of breath.   Chest pain.   Fever of 102 F (38.9 C) or higher.   Gush of clear fluid from your vagina.   Painful urination.   Domestic violence.   Irregular heartbeat (palpitations).   Rapid beating of the heart (tachycardia).   Constant feeling sick to your stomach (nauseous) and vomiting.   Trouble walking, fluid retention (edema).   Muscle weakness.   If your baby has decreased activity.   Persistent diarrhea.   Abnormal vaginal discharge.   Uterine contractions at 20-minute intervals.   Back pain that travels down your leg.  L Learn and practice that what you eat and drink should be in moderation and healthy for you and your baby. Legal drugs such as alcohol and caffeine are important issues for pregnant women. There is no safe amount of alcohol a woman can drink while pregnant. Fetal alcohol syndrome, a disorder characterized by growth retardation, facial abnormalities, and central nervous system dysfunction, is caused by a woman's use of alcohol during pregnancy. Caffeine, found in tea, coffee, soft drinks and chocolate, should also be limited. Be sure to read labels when trying to cut down on caffeine during pregnancy. More than 200 foods, beverages, and over-the-counter medications contain caffeine and have a high salt content! There are coffees and teas that do not contain caffeine. M Medical conditions such as diabetes, epilepsy, and high blood pressure should be treated and kept under control before pregnancy when possible, but especially during pregnancy. Ask your caregiver about any medications that may need to be changed or adjusted during pregnancy. If you are currently taking any medications, ask your caregiver if it  is safe to take them while you are pregnant or before getting pregnant when possible. Also, be sure to discuss any herbs or vitamins you are taking. They are medicines, too! Discuss with your doctor all medications, prescribed and over-the-counter, that you are taking. During your prenatal visit, discuss the medications your doctor may give you during labor and delivery. N Never be afraid to ask your doctor or caregiver questions about your health, the progress of the pregnancy, family problems, stressful situations, and recommendation for a pediatrician, if you do not have one. It is better to take all precautions and discuss any questions or concerns you may have during your office visits. It is a good idea to write down your questions before you visit the doctor. O Over-the-counter cough and cold remedies may contain alcohol or other ingredients that should be avoided during pregnancy. Ask your caregiver about prescription, herbs or over-the-counter medications that you are taking or may consider taking while pregnant.  P Physical activity during pregnancy can   benefit both you and your baby by lessening discomfort and fatigue, providing a sense of well-being, and increasing the likelihood of early recovery after delivery. Light to moderate exercise during pregnancy strengthens the belly (abdominal) and back muscles. This helps improve posture. Practicing yoga, walking, swimming, and cycling on a stationary bicycle are usually safe exercises for pregnant women. Avoid scuba diving, exercise at high altitudes (over 3000 feet), skiing, horseback riding, contact sports, etc. Always check with your doctor before beginning any kind of exercise, especially during pregnancy and especially if you did not exercise before getting pregnant. Q Queasiness, stomach upset and morning sickness are common during pregnancy. Eating a couple of crackers or dry toast before getting out of bed. Foods that you normally love may  make you feel sick to your stomach. You may need to substitute other nutritious foods. Eating 5 or 6 small meals a day instead of 3 large ones may make you feel better. Do not drink with your meals, drink between meals. Questions that you have should be written down and asked during your prenatal visits. R Read about and make plans to baby-proof your home. There are important tips for making your home a safer environment for your baby. Review the tips and make your home safer for you and your baby. Read food labels regarding calories, salt and fat content in the food. S Saunas, hot tubs, and steam rooms should be avoided while you are pregnant. Excessive high heat may be harmful during your pregnancy. Your caregiver will screen and examine you for sexually transmitted diseases and genetic disorders during your prenatal visits. Learn the signs of labor. Sexual relations while pregnant is safe unless there is a medical or pregnancy problem and your caregiver advises against it. T Traveling long distances should be avoided especially in the third trimester of your pregnancy. If you do have to travel out of state, be sure to take a copy of your medical records and medical insurance plan with you. You should not travel long distances without seeing your doctor first. Most airlines will not allow you to travel after 36 weeks of pregnancy. Toxoplasmosis is an infection caused by a parasite that can seriously harm an unborn baby. Avoid eating undercooked meat and handling cat litter. Be sure to wear gloves when gardening. Tingling of the hands and fingers is not unusual and is due to fluid retention. This will go away after the baby is born. U Womb (uterus) size increases during the first trimester. Your kidneys will begin to function more efficiently. This may cause you to feel the need to urinate more often. You may also leak urine when sneezing, coughing or laughing. This is due to the growing uterus pressing  against your bladder, which lies directly in front of and slightly under the uterus during the first few months of pregnancy. If you experience burning along with frequency of urination or bloody urine, be sure to tell your doctor. The size of your uterus in the third trimester may cause a problem with your balance. It is advisable to maintain good posture and avoid wearing high heels during this time. An ultrasound of your baby may be necessary during your pregnancy and is safe for you and your baby. V Vaccinations are an important concern for pregnant women. Get needed vaccines before pregnancy. Center for Disease Control (www.cdc.gov) has clear guidelines for the use of vaccines during pregnancy. Review the list, be sure to discuss it with your doctor. Prenatal vitamins are helpful   and healthy for you and the baby. Do not take extra vitamins except what is recommended. Taking too much of certain vitamins can cause overdose problems. Continuous vomiting should be reported to your caregiver. Varicose veins may appear especially if there is a family history of varicose veins. They should subside after the delivery of the baby. Support hose helps if there is leg discomfort. W Being overweight or underweight during pregnancy may cause problems. Try to get within 15 pounds of your ideal weight before pregnancy. Remember, pregnancy is not a time to be dieting! Do not stop eating or start skipping meals as your weight increases. Both you and your baby need the calories and nutrition you receive from a healthy diet. Be sure to consult with your doctor about your diet. There is a formula and diet plan available depending on whether you are overweight or underweight. Your caregiver or nutritionist can help and advise you if necessary. X Avoid X-rays. If you must have dental work or diagnostic tests, tell your dentist or physician that you are pregnant so that extra care can be taken. X-rays should only be taken when  the risks of not taking them outweigh the risk of taking them. If needed, only the minimum amount of radiation should be used. When X-rays are necessary, protective lead shields should be used to cover areas of the body that are not being X-rayed. Y Your baby loves you. Breastfeeding your baby creates a loving and very close bond between the two of you. Give your baby a healthy environment to live in while you are pregnant. Infants and children require constant care and guidance. Their health and safety should be carefully watched at all times. After the baby is born, rest or take a nap when the baby is sleeping. Z Get your ZZZs. Be sure to get plenty of rest. Resting on your side as often as possible, especially on your left side is advised. It provides the best circulation to your baby and helps reduce swelling. Try taking a nap for 30 to 45 minutes in the afternoon when possible. After the baby is born rest or take a nap when the baby is sleeping. Try elevating your feet for that amount of time when possible. It helps the circulation in your legs and helps reduce swelling.  Most information courtesy of the CDC. Document Released: 04/01/2005 Document Revised: 03/21/2011 Document Reviewed: 12/14/2008 [employer] Patient Information 2012 [employer]. 

## 2011-08-15 NOTE — Progress Notes (Signed)
New GYN pt   Subjective:   Pt recently had a positive pregnancy test.  She is concerned because she delivered her last infant 4/24/[birth year].  That daughter died during cardiac surgery in [place] for a congenital defect at only a few days of age.She has questions as to whether this defect will occur again.  She has had no vaginal bleeding since LMP.  She denies nausea and vomiting.  Past Hx: CS x2. Pt not allowed to labor. Was told her pelvis was too small       Hypothyroid with recent increase in synthroid to because of elevated TSH  Objective:  BP 120/70  Ht 4' 11.75" (1.518 m)  Wt 125 lb (56.7 kg)  BMI 24.62 kg/m2  LMP 06/20/2011   Pelvic exam:  VULVA: normal appearing vulva with no masses, tenderness or lesions               VAGINA: normal appearing vagina with normal color and discharge, no lesions,      CERVIX: normal appearing cervix without discharge or lesions,      UTERUS: enlarged to 8 week's size,      ADNEXA: normal adnexa in size, nontender and no masses,      RECTAL:  rectal exam not indicated. 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

## 2011-08-20 ENCOUNTER — Encounter (HOSPITAL_COMMUNITY): Payer: Self-pay

## 2011-08-22 ENCOUNTER — Ambulatory Visit (HOSPITAL_COMMUNITY)
Admission: RE | Admit: 2011-08-22 | Discharge: 2011-08-22 | Disposition: A | Discharge status: Home / Self Care | Payer: Medicaid Other | Source: Ambulatory Visit | Attending: Obstetrics and Gynecology | Admitting: Obstetrics and Gynecology

## 2011-08-22 ENCOUNTER — Encounter (HOSPITAL_COMMUNITY): Payer: Self-pay

## 2011-08-22 DIAGNOSIS — Z8279 Family history of other congenital malformations, deformations and chromosomal abnormalities: Secondary | ICD-10-CM | POA: Insufficient documentation

## 2011-08-22 DIAGNOSIS — O09529 Supervision of elderly multigravida, unspecified trimester: Secondary | ICD-10-CM

## 2011-08-22 NOTE — Progress Notes (Signed)
Genetic Counseling  High-Risk Gestation Note  Appointment Date:  08/22/2011 Referred By: Hal Morales, MD Date of Birth:  01-26-1976 Partner:  Antonietta West   Estimated Date of Delivery: 03/26/2012 Estimated Gestational Age: [redacted]w[redacted]d Attending: Particia Nearing, MD    [employer] and her husband, Carmen West was seen for genetic counseling because of a maternal age of 36 y.o. and given a previous daughter who died due to congenital heart disease.      They were counseled regarding maternal age and the association with risk for chromosome conditions due to nondisjunction with aging of the ova.   We reviewed chromosomes, nondisjunction, and the associated 1 in 59 risk for fetal aneuploidy in the first trimester related to a maternal age of 36 y.o. at delivery.  They were counseled that the risk for aneuploidy decreases as gestational age increases, accounting for those pregnancies which spontaneously abort.  We specifically discussed Down syndrome (trisomy 65), trisomies 44 and 41, and sex chromosome aneuploidies (47,XXX and 47,XXY) including the common features and prognoses of each.   We reviewed available screening and diagnostic options.  Regarding screening tests, we discussed the options of First screen, Quad screen and ultrasound.  They understand that screening tests are used to modify a patient's a priori risk for aneuploidy, typically based on age.  This estimate provides a pregnancy specific risk assessment. We discussed another type of screening test, noninvasive prenatal testing (NIPT), which utilizes cell free fetal DNA found in the maternal circulation. This test is not diagnostic for chromosome conditions, but can provide information regarding the presence or absence of extra fetal DNA for chromosomes 13, 18 and 21. Thus, it would not identify or rule out all fetal aneuploidy. The reported detection rate is greater than 99% for Trisomy 21, greater than 97% for Trisomy 18, and is  approximately 80% (8 out of 10) for Trisomy 13. The false positive rate is reported to be less than 1% for any of these conditions.  We also reviewed the availability of diagnostic options including CVS and amniocentesis.  We discussed the risks, limitations, and benefits of each.    After reviewing these options, Carmen West elected to have ultrasound only, but declined additional screening and testing for aneuploidy including First screen, Quad screen, cell free fetal DNA testing, CVS, and amniocentesis. The couple expressed interest in ultrasound for nuchal translucency assessment only given its utility as a screen for congenital heart disease. They specifically declined First trimester screening (ultrasound NT measurement and maternal biochemistry) given that they are not interested in pursuing the screening for aneuploidy risk assessment.  We are happy to perform ultrasounds in our office, if desired. No follow-up appointments were made for the patient at this time.  They understand that ultrasound cannot rule out all birth defects or genetic syndromes. The patient was advised of this limitation and states she still does not want diagnostic testing at this time.  However, they were counseled that 50-80% of fetuses with Down syndrome and up to 90% of fetuses with trisomies 13 and 18, when well visualized, have detectable anomalies or soft markers by ultrasound.   Both family histories were reviewed and found to be contributory for a previous daughter with congenital heart disease. The couple reported that their daughter, Carmen West, was born 2013-05-24and passed away at 35 days of age. At birth, she was noted to have difficulty breathing, and congenital heart disease was diagnosed. The specific type of congenital heart disease  was not known by the couple. They reported that a vein in the heart that carries oxygen was too small. A heart catheterization was reportedly performed and was 60% successful.  Additionally, the couple reported that it was described that blood was flowing in two directions through a valve in the heart and that the heart overall was abnormal. Their daughter was otherwise healthy, and no additional birth defects were reportedly present.   Congenital heart defects occur in approximately 1% of pregnancies.  Congenital heart defects may occur due to multifactorial influences, chromosomal abnormalities, genetic syndromes or environmental exposures. The patient reported no environmental exposures in her previous pregnancy that are associated with an increased risk for congenital heart defects. We discussed that isolated heart defects are generally multifactorial.  Given the reported family history of an apparently isolated congenital heart defect and assuming multifactorial inheritance, the risk for a congenital heart defect in the current pregnancy, a first degree relative (full sibling) to the couple's previous child is approximately 2-3%. We discussed that this recurrence risk may be altered in the case of a different underlying etiology. We reviewed the option of second trimester detailed ultrasound to assess fetal growth and anatomy in detail as well as a fetal echocardiogram to assess the fetal heart in more detail. We discussed that both targeted ultrasound and fetal echocardiogram can be performed at 18 weeks. The couple stated that they would like to proceed with both second trimester targeted ultrasound and 18 week fetal echocardiogram. We are happy to facilitate this, if desired.   Additionally, Carmen West reported a female paternal first cousin who had a baby who died at 45 days of age. She had very limited information regarding the features of this baby but reported that the family knew in the pregnancy that the life span would be limited. The patient reported that there was not a problem with the baby's heart, and that it was possibly a birth defect involving the brain. We  discussed that additional information is needed regarding the specific medical concerns in order to assess potential implications for relatives. Without further information regarding the provided family history, an accurate genetic risk cannot be calculated. Further genetic counseling is warranted if more information is obtained.  Carmen West denied exposure to environmental toxins or chemical agents. She denied the use of alcohol, tobacco or street drugs. She denied significant viral illnesses during the course of her pregnancy. Her medical and surgical histories were contributory for hypothyroidism for which she is treated with levothyroxine.   I counseled this couple regarding the above risks and available options.  The approximate face-to-face time with the genetic counselor was 55 minutes.  Quinn Plowman, MS,  Certified Genetic Counselor 08/22/2011

## 2011-08-23 ENCOUNTER — Encounter (HOSPITAL_COMMUNITY): Payer: Self-pay

## 2011-08-28 ENCOUNTER — Encounter: Payer: Self-pay | Admitting: Obstetrics and Gynecology

## 2011-09-05 ENCOUNTER — Other Ambulatory Visit: Payer: Self-pay | Admitting: Obstetrics and Gynecology

## 2011-09-05 ENCOUNTER — Telehealth: Payer: Self-pay | Admitting: Obstetrics and Gynecology

## 2011-09-05 DIAGNOSIS — Z20828 Contact with and (suspected) exposure to other viral communicable diseases: Secondary | ICD-10-CM

## 2011-09-05 NOTE — Telephone Encounter (Signed)
Pt to go to [address] location for [employer].    To have parvo titers today or tomorrow.  ld

## 2011-09-08 ENCOUNTER — Emergency Department (HOSPITAL_COMMUNITY)
Admission: EM | Admit: 2011-09-08 | Discharge: 2011-09-08 | Disposition: A | Discharge status: Home / Self Care | Payer: Medicaid Other | Attending: Emergency Medicine | Admitting: Emergency Medicine

## 2011-09-08 ENCOUNTER — Encounter (HOSPITAL_COMMUNITY): Payer: Self-pay | Admitting: Family Medicine

## 2011-09-08 DIAGNOSIS — E079 Disorder of thyroid, unspecified: Secondary | ICD-10-CM | POA: Insufficient documentation

## 2011-09-08 DIAGNOSIS — R002 Palpitations: Secondary | ICD-10-CM

## 2011-09-08 NOTE — Discharge Instructions (Signed)
Palpitations  A palpitation is the feeling that your heartbeat is irregular or is faster than normal. Although this is frightening, it usually is not serious. Palpitations may be caused by excesses of smoking, caffeine, or alcohol. They are also brought on by stress and anxiety. Sometimes, they are caused by heart disease. Unless otherwise noted, your caregiver did not find any signs of serious illness at this time. HOME CARE INSTRUCTIONS  To help prevent palpitations:  Drink decaffeinated coffee, tea, and soda pop. Avoid chocolate.   If you smoke or drink alcohol, quit or cut down as much as possible.   Reduce your stress or anxiety level. Biofeedback, yoga, or meditation will help you relax. Physical activity such as swimming, jogging, or walking also may be helpful.  SEEK MEDICAL CARE IF:   You continue to have a fast heartbeat.   Your palpitations occur more often.  SEEK IMMEDIATE MEDICAL CARE IF: You develop chest pain, shortness of breath, severe headache, dizziness, or fainting. Document Released: 03/29/2000 Document Revised: 03/21/2011 Document Reviewed: 05/29/2007 ExitCare Patient Information 2012 [employer]. 

## 2011-09-08 NOTE — ED Notes (Signed)
Pt sts fell this morning. sts she is about [redacted] weeks pregnant. Pt sts fell on bottom. Before fall sts SOB and heart was racing and was fighting with husband.

## 2011-09-08 NOTE — ED Provider Notes (Signed)
History   This chart was scribed for Lyanne Co, MD by Toya Smothers. The patient was seen in room STRE5/STRE5. Patient's care was started at 1155.  CSN: [id number]  Arrival date & time 09/08/11  1155   First MD Initiated Contact with Patient 09/08/11 1230     Chief Complaint  Patient presents with  . Fall    HPI  Carmen West is a 36 y.o. female who presents to the Emergency Department complaining sudden onset mild  SOB  onset  10 hours ago with associated palpitations denying syncope and loss of consciousness. Pt states that she  fell after an argument with her husband and she became light headed. Pt states that she is currently [redacted] weeks pregnant and has a h/o thyroid disease.  No history of palpitations before in the past.  She has no prior cardiac history.  At this time she is without palpitations or shortness of breath.  She has no chest pain.  She is 9-[redacted] weeks pregnant and is concerned about the safety of her unborn child   Past Medical History  Diagnosis Date  . Thyroid disease   . Headache, migraine     Past Surgical History  Procedure Date  . Cesarean section     Family History  Problem Relation Age of Onset  . Kidney disease Father   . Diabetes Maternal Uncle   . Congenital heart disease Daughter     died 2 days of age    History  Substance Use Topics  . Smoking status: Never Smoker   . Smokeless tobacco: Not on file  . Alcohol Use: No   Review of Systems  Constitutional: Negative for fever and chills.  Respiratory: Positive for shortness of breath.   Cardiovascular: Positive for palpitations.  Gastrointestinal: Negative for nausea and vomiting.  Neurological: Negative for syncope and weakness.       Neg loss of consciousness    A complete 10 system review of systems was obtained and all systems are negative except as noted in the HPI and PMH.   Allergies  Review of patient's allergies indicates no known allergies.  Home Medications   Current  Outpatient Rx  Name Route Sig Dispense Refill  . LEVOTHYROXINE SODIUM 125 MCG PO TABS Oral Take 125 mcg by mouth daily.    Marland Kitchen PRENATAL MULTIVITAMIN CH Oral Take 1 tablet by mouth daily.      BP 141/73  Pulse 105  Temp(Src) 98.7 F (37.1 C) (Oral)  Resp 18  SpO2 100%  LMP 06/20/2011  Physical Exam  Nursing note and vitals reviewed. Constitutional: She is oriented to person, place, and time. She appears well-developed and well-nourished. No distress.  HENT:  Head: Normocephalic and atraumatic.  Eyes: EOM are normal. Pupils are equal, round, and reactive to light.  Neck: Neck supple. No tracheal deviation present.  Cardiovascular: Normal rate, regular rhythm, normal heart sounds and intact distal pulses.   Pulmonary/Chest: Effort normal and breath sounds normal. No respiratory distress. She has no wheezes. She has no rales. She exhibits no tenderness.  Abdominal: Soft. She exhibits no distension.  Musculoskeletal: Normal range of motion. She exhibits no edema.  Neurological: She is alert and oriented to person, place, and time. No sensory deficit.  Skin: Skin is warm and dry.  Psychiatric: She has a normal mood and affect. Her behavior is normal.    ED Course  Procedures (including critical care time)   Date: 09/08/2011  Rate: 89  Rhythm: normal  sinus rhythm  QRS Axis: normal  Intervals: normal  ST/T Wave abnormalities: normal  Conduction Disutrbances: none  Narrative Interpretation:   Old EKG Reviewed: No significant changes noted     DIAGNOSTIC STUDIES: Oxygen Saturation is 100% on room air, normal by my interpretation.    COORDINATION OF CARE:   Labs Reviewed - No data to display No results found.   1. Palpitations       MDM  The patient had transient palpitations during an argument.  He said since resolved.  Her EKG demonstrates normal sinus rhythm without ectopy.  She's been referred to cardiology for outpatient followup and possible Holter monitoring.   The patient understands importance of returning emergency department for new or worsening symptoms  I personally performed the services described in this documentation, which was scribed in my presence. The recorded information has been reviewed and considered.          Lyanne Co, MD 09/08/11 [phone]

## 2011-09-10 ENCOUNTER — Encounter: Payer: Self-pay | Admitting: Obstetrics and Gynecology

## 2011-09-10 ENCOUNTER — Ambulatory Visit (INDEPENDENT_AMBULATORY_CARE_PROVIDER_SITE_OTHER): Payer: Medicaid Other | Admitting: Obstetrics and Gynecology

## 2011-09-10 DIAGNOSIS — R109 Unspecified abdominal pain: Secondary | ICD-10-CM

## 2011-09-10 LAB — POCT URINALYSIS DIPSTICK
Blood, UA: NEGATIVE
Spec Grav, UA: 1.01
Urobilinogen, UA: NEGATIVE
pH, UA: 8

## 2011-09-10 NOTE — Patient Instructions (Signed)
Patient Education Materials to be provided at check out (*indicates is located in accordion folder):  Easing Back Pain During Pregnancy  

## 2011-09-10 NOTE — Progress Notes (Signed)
36 yo [place] woman here to review concerns about current pregnancy and wanting to discuss in [place]. 11+4 weeks. No physical complaints except headaches. Anxious about cardiac malformation because second daughter deceased at 24 days of age of complications during a cardiac cath. Pt unable to name the final diagnosis but will bring me all reports from [place] to review and translate.  25 minutes visit to answer all questions and review plan of care in prenatal visits and need for echocardiogram.  Was seen by genetic counselor 08/22/11: declined aneuploidy screening and testing but desires to pursue echocardiogram Risk of recurrence is  2-3 %.  Will schedule NOB interview and exam with ultrasound to confirm viability. Will check TSH and Toxoplasmosis immunity with prenatal labs

## 2011-09-13 ENCOUNTER — Encounter: Payer: Self-pay | Admitting: Obstetrics and Gynecology

## 2011-09-18 ENCOUNTER — Other Ambulatory Visit: Payer: Self-pay | Admitting: Obstetrics and Gynecology

## 2011-09-18 ENCOUNTER — Encounter: Payer: Self-pay | Admitting: Obstetrics and Gynecology

## 2011-09-18 ENCOUNTER — Ambulatory Visit (INDEPENDENT_AMBULATORY_CARE_PROVIDER_SITE_OTHER): Payer: Medicaid Other | Admitting: Obstetrics and Gynecology

## 2011-09-18 ENCOUNTER — Ambulatory Visit (INDEPENDENT_AMBULATORY_CARE_PROVIDER_SITE_OTHER): Payer: Self-pay

## 2011-09-18 VITALS — BP 128/62 | Resp 16 | Wt 124.0 lb

## 2011-09-18 DIAGNOSIS — O09529 Supervision of elderly multigravida, unspecified trimester: Secondary | ICD-10-CM

## 2011-09-18 DIAGNOSIS — O09299 Supervision of pregnancy with other poor reproductive or obstetric history, unspecified trimester: Secondary | ICD-10-CM

## 2011-09-18 DIAGNOSIS — O3680X0 Pregnancy with inconclusive fetal viability, not applicable or unspecified: Secondary | ICD-10-CM

## 2011-09-18 NOTE — Patient Instructions (Signed)
Patient Education Materials to be provided at check out (*indicates is located in Garment/textile technologist):  [employer]

## 2011-09-18 NOTE — Progress Notes (Signed)
Here for viability confirmation and to turn in Echocardiogram report from previous pregnancy ( in [place]) Ultrasound: S=D,normal AFI

## 2011-09-30 ENCOUNTER — Encounter: Payer: Self-pay | Admitting: Obstetrics and Gynecology

## 2011-10-10 ENCOUNTER — Ambulatory Visit (INDEPENDENT_AMBULATORY_CARE_PROVIDER_SITE_OTHER): Payer: Medicaid Other | Admitting: Obstetrics and Gynecology

## 2011-10-10 DIAGNOSIS — Z331 Pregnant state, incidental: Secondary | ICD-10-CM

## 2011-10-10 LAB — POCT URINALYSIS DIPSTICK
Glucose, UA: NEGATIVE
Leukocytes, UA: NEGATIVE
Nitrite, UA: NEGATIVE
Protein, UA: NEGATIVE
Spec Grav, UA: <=1.005
Urobilinogen, UA: NEGATIVE

## 2011-10-10 NOTE — Progress Notes (Signed)
PT STATES HAS APPT WITH ENDOCRINOLOGIST NEXT WEEK AND WILL HAVE F/U TSH.

## 2011-10-11 LAB — PRENATAL PANEL VII
Basophils Relative: 0 % (ref 0–1)
Eosinophils Absolute: 0 10*3/uL (ref 0.0–0.7)
HCT: 38.9 % (ref 36.0–46.0)
HIV: NONREACTIVE
Hemoglobin: 13.5 g/dL (ref 12.0–15.0)
Lymphs Abs: 1.7 10*3/uL (ref 0.7–4.0)
MCH: 30.7 pg (ref 26.0–34.0)
MCHC: 34.7 g/dL (ref 30.0–36.0)
Monocytes Absolute: 0.4 10*3/uL (ref 0.1–1.0)
Monocytes Relative: 5 % (ref 3–12)
Neutro Abs: 6.2 10*3/uL (ref 1.7–7.7)
RBC: 4.4 MIL/uL (ref 3.87–5.11)
Rh Type: POSITIVE
Rubella: >500 IU/mL — ABNORMAL HIGH

## 2011-10-12 LAB — CULTURE, OB URINE: Organism ID, Bacteria: NO GROWTH

## 2011-10-14 LAB — HEMOGLOBINOPATHY EVALUATION
Hgb F Quant: 0 % (ref 0.0–2.0)
Hgb S Quant: 0 %

## 2011-10-22 ENCOUNTER — Telehealth: Payer: Self-pay | Admitting: Obstetrics and Gynecology

## 2011-10-22 NOTE — Telephone Encounter (Signed)
Tc from pt. States is having pain in back, lower abd and legs.  Is not constant.   Does not feel like cramping.   No bleeding. Has had "very little " water today.   Sometimes has dysuria after voiding.  Per [facility] advised pt to increase water.   May take Tylenol. Sched for eval with Dr ND 10/23/11.  Pt to call after hours if no improvement or sx increase.  Pt verbalizes comprehension.

## 2011-10-23 ENCOUNTER — Encounter: Payer: Self-pay | Admitting: Obstetrics and Gynecology

## 2011-10-23 ENCOUNTER — Ambulatory Visit (INDEPENDENT_AMBULATORY_CARE_PROVIDER_SITE_OTHER): Payer: Medicaid Other | Admitting: Obstetrics and Gynecology

## 2011-10-23 VITALS — BP 100/60 | Wt 128.0 lb

## 2011-10-23 DIAGNOSIS — R300 Dysuria: Secondary | ICD-10-CM

## 2011-10-23 LAB — POCT URINALYSIS DIPSTICK
Nitrite, UA: NEGATIVE
Protein, UA: NEGATIVE
Urobilinogen, UA: NEGATIVE
pH, UA: 8

## 2011-10-23 NOTE — Patient Instructions (Signed)
Patient Education Materials to be provided at check out (*indicates is located in accordion folder):  Easing Back Pain During Pregnancy  

## 2011-10-23 NOTE — Progress Notes (Signed)
Pt c/o pain with urination and cramping and back pain pt states she do not drink water

## 2011-10-23 NOTE — Progress Notes (Signed)
Pt with several concerns 1) last baby died of heart defect.  Will schedule fetal echo at 20 weeks 2) pt with back pain and leg pain.  cx unchanged.  Pt given ACOG on back pain and pregnancy.  Send to PT for evaluation 3) pt to have [place] for anatomy @[place]  4) pt declined genetic screening

## 2011-10-24 ENCOUNTER — Telehealth: Payer: Self-pay

## 2011-10-24 NOTE — Telephone Encounter (Signed)
Message copied by Rolla Plate on Thu Oct 24, 2011  3:27 PM ------      Message from: Jaymes Graff      Created: Wed Oct 23, 2011  5:21 PM       Please schedule fetal echo @ 20 weeks and PT [facility]

## 2011-10-24 NOTE — Telephone Encounter (Signed)
Lm on vm tcb rgd referral pt has appt with [facility] 11-14-11 at 10:00 for fetal echo also record [facility] to [facility] PT for leg and back pain they will contact pt with appt

## 2011-10-25 ENCOUNTER — Other Ambulatory Visit: Payer: Self-pay

## 2011-10-25 DIAGNOSIS — M79606 Pain in leg, unspecified: Secondary | ICD-10-CM

## 2011-10-25 DIAGNOSIS — M549 Dorsalgia, unspecified: Secondary | ICD-10-CM

## 2011-10-25 NOTE — Telephone Encounter (Signed)
Tc from [facility] they do not accept pt insurance pt referred to [facility] they will contact pt with appt

## 2011-10-27 ENCOUNTER — Inpatient Hospital Stay (HOSPITAL_COMMUNITY)
Admission: AD | Admit: 2011-10-27 | Discharge: 2011-10-28 | Disposition: A | Discharge status: Hospice | Payer: Medicaid Other | Source: Ambulatory Visit | Attending: Obstetrics and Gynecology | Admitting: Obstetrics and Gynecology

## 2011-10-27 ENCOUNTER — Other Ambulatory Visit: Payer: Self-pay | Admitting: Obstetrics and Gynecology

## 2011-10-27 DIAGNOSIS — O09529 Supervision of elderly multigravida, unspecified trimester: Secondary | ICD-10-CM

## 2011-10-27 DIAGNOSIS — O09299 Supervision of pregnancy with other poor reproductive or obstetric history, unspecified trimester: Secondary | ICD-10-CM

## 2011-10-27 DIAGNOSIS — O2390 Unspecified genitourinary tract infection in pregnancy, unspecified trimester: Secondary | ICD-10-CM | POA: Insufficient documentation

## 2011-10-27 DIAGNOSIS — R300 Dysuria: Secondary | ICD-10-CM | POA: Insufficient documentation

## 2011-10-27 DIAGNOSIS — N390 Urinary tract infection, site not specified: Secondary | ICD-10-CM | POA: Insufficient documentation

## 2011-10-27 LAB — CULTURE, OB URINE

## 2011-10-27 NOTE — MAU Note (Signed)
Pt G3 P1 at 17.4wks having burning with urination and frequency x 2 hrs.  Headache today.

## 2011-10-28 ENCOUNTER — Encounter (HOSPITAL_COMMUNITY): Payer: Self-pay | Admitting: *Deleted

## 2011-10-28 ENCOUNTER — Encounter: Payer: Self-pay | Admitting: Obstetrics and Gynecology

## 2011-10-28 ENCOUNTER — Ambulatory Visit (INDEPENDENT_AMBULATORY_CARE_PROVIDER_SITE_OTHER): Payer: Medicaid Other

## 2011-10-28 ENCOUNTER — Telehealth: Payer: Self-pay | Admitting: Obstetrics and Gynecology

## 2011-10-28 ENCOUNTER — Ambulatory Visit (INDEPENDENT_AMBULATORY_CARE_PROVIDER_SITE_OTHER): Payer: Medicaid Other | Admitting: Obstetrics and Gynecology

## 2011-10-28 VITALS — BP 98/62 | Wt 128.0 lb

## 2011-10-28 DIAGNOSIS — Z331 Pregnant state, incidental: Secondary | ICD-10-CM

## 2011-10-28 DIAGNOSIS — Z3689 Encounter for other specified antenatal screening: Secondary | ICD-10-CM

## 2011-10-28 DIAGNOSIS — R300 Dysuria: Secondary | ICD-10-CM

## 2011-10-28 DIAGNOSIS — N390 Urinary tract infection, site not specified: Secondary | ICD-10-CM | POA: Diagnosis present

## 2011-10-28 LAB — URINALYSIS, ROUTINE W REFLEX MICROSCOPIC
Bilirubin Urine: NEGATIVE
Glucose, UA: NEGATIVE mg/dL
Hgb urine dipstick: NEGATIVE
Ketones, ur: NEGATIVE mg/dL
Protein, ur: NEGATIVE mg/dL
pH: 6.5 (ref 5.0–8.0)

## 2011-10-28 LAB — POCT URINALYSIS DIPSTICK
Bilirubin, UA: NEGATIVE
Ketones, UA: NEGATIVE
Leukocytes, UA: NEGATIVE
Protein, UA: NEGATIVE
Spec Grav, UA: <=1.005
pH, UA: 8

## 2011-10-28 LAB — URINE MICROSCOPIC-ADD ON

## 2011-10-28 LAB — POCT WET PREP (WET MOUNT): Clue Cells Wet Prep Whiff POC: NEGATIVE

## 2011-10-28 MED ORDER — NITROFURANTOIN MONOHYD MACRO 100 MG PO CAPS: 100.0000 mg | ORAL_CAPSULE | Freq: Once | ORAL | Status: DC

## 2011-10-28 MED ORDER — NITROFURANTOIN MONOHYD MACRO 100 MG PO CAPS
100.0000 mg | ORAL_CAPSULE | Freq: Once | ORAL | Status: AC
  Administered 2011-10-28: 100 mg via ORAL
  Filled 2011-10-28: qty 1

## 2011-10-28 MED ORDER — NITROFURANTOIN MONOHYD MACRO 100 MG PO CAPS: 100.0000 mg | ORAL_CAPSULE | Freq: Two times a day (BID) | ORAL | Status: AC

## 2011-10-28 MED ORDER — NITROFURANTOIN MONOHYD MACRO 100 MG PO CAPS: 100.0000 mg | ORAL_CAPSULE | Freq: Two times a day (BID) | ORAL | Status: DC

## 2011-10-28 NOTE — MAU Note (Signed)
Pt has had urge to urinate frequently since 2100 tonight along with pain at the end of the stream.

## 2011-10-28 NOTE — Telephone Encounter (Signed)
TC from patient--17 weeks, with dysuria, frequency, urgency since 9pm.  Hx UTI last year--"same symptoms". No leaking of fluid, fever, abdominal or back pain. Positive FM.  Come to MAU.

## 2011-10-28 NOTE — MAU Provider Note (Signed)
History   36 yo G3P2001 at 51 5/7 weeks presented after calling c/o dysuria, frequency, and urgency since approx 9pm tonight. Had UTI last year, with similar symptoms.  Denies fever, bleeding, leaking, back pain, or contractions.  Reports +FM.  Has anatomy scan at [facility] at approx 1pm today.  She does have mild headache, but has not taken any medications.  Of note, she had a urine culture done at her office visit on 7/10, with 80,000 col Staph coagulase, with sensitivity to Macrobid, but no symptoms at that time.  Hx remarkable for: Patient Active Problem List  Diagnosis  . Amenorrhea  . Family history of congenital heart defect  . AMA (advanced maternal age) multigravida 35+  . Previous pregnancy with congenital heart defect, currently pregnant  . UTI (lower urinary tract infection)  Last baby died of cardiac malformation, so patient is very anxious regarding this pregnancy and the impact of any medications on maternal/fetal status.   Chief Complaint  Patient presents with  . Dysuria     OB History    Grav Para Term Preterm Abortions TAB SAB Ect Mult Living   3 2 2       1       Past Medical History  Diagnosis Date  . Thyroid disease   . Hypothyroidism 2009    DR JAEJALI  . Depression     AFTER LOSS OF BABY;  NO MEDS  . Shortness of breath     DURING SLEEP 3-4 X MONTH  . Infection   . Headache, migraine     IMPROVED  . UTI (urinary tract infection) following delivery     Past Surgical History  Procedure Date  . Cesarean section     Family History  Problem Relation Age of Onset  . Kidney disease Father     STONES  . Diabetes Maternal Uncle   . Congenital heart disease Daughter     died 65 days of age  . Birth defects Daughter     HEART DEFECT  . Hypertension Mother   . Arthritis Mother   . Heart disease Mother   . Hypothyroidism Mother   . Hypothyroidism Sister   . Birth defects Cousin     HYDOCEPHALIC    History  Substance Use Topics  . Smoking  status: Never Smoker   . Smokeless tobacco: Never Used  . Alcohol Use: No    Allergies: No Known Allergies  Prescriptions prior to admission  Medication Sig Dispense Refill  . levothyroxine (SYNTHROID, LEVOTHROID) 125 MCG tablet Take 125 mcg by mouth daily.      . Prenatal Vit-DSS-Fe Fum-FA (SE-NATAL 19) 29-1 MG TABS Take by mouth.         Physical Exam   Blood pressure 118/70, pulse 80, resp. rate 16, last menstrual period 06/20/2011.  Chest clear Heart RRR without murmur Abd gravid, NT Uterus 17-18 week size, NT No CVAT FHR 160 bpm Ext WNL  Results for orders placed during the hospital encounter of 10/27/11 (from the past 24 hour(s))  URINALYSIS, ROUTINE W REFLEX MICROSCOPIC     Status: Abnormal   Collection Time   10/27/11 11:00 PM      Component Value Range   Color, Urine YELLOW  YELLOW   APPearance CLEAR  CLEAR   Specific Gravity, Urine <1.005 (*) 1.005 - 1.030   pH 6.5  5.0 - 8.0   Glucose, UA NEGATIVE  NEGATIVE mg/dL   Hgb urine dipstick NEGATIVE  NEGATIVE   Bilirubin Urine  NEGATIVE  NEGATIVE   Ketones, ur NEGATIVE  NEGATIVE mg/dL   Protein, ur NEGATIVE  NEGATIVE mg/dL   Urobilinogen, UA 0.2  0.0 - 1.0 mg/dL   Nitrite NEGATIVE  NEGATIVE   Leukocytes, UA SMALL (*) NEGATIVE  URINE MICROSCOPIC-ADD ON     Status: Abnormal   Collection Time   10/27/11 11:00 PM      Component Value Range   Squamous Epithelial / LPF RARE  RARE   WBC, UA 3-6  <3 WBC/hpf   RBC / HPF 0-2  <3 RBC/hpf   Bacteria, UA FEW (*) RARE     ED Course  IUP at 17 5/7 weeks UTI  Plan: Rx Macrobid BID x 7 days (1st dose in MAU), with [facility] Urine to culture. UTI discharge instructions. Keep scheduled appointment at [facility] for [place] later today. Call with any increase/worsening of symptoms.   Tierria Watson CNM, [place] 10/28/2011 1:10 AM

## 2011-10-28 NOTE — Telephone Encounter (Signed)
Pt here for appt informed appt for fetal echo 11-14-11 at 10:00 at [facility] pt voice understanding

## 2011-10-28 NOTE — Progress Notes (Signed)
Pt. Stated no issues today .NOB work -up today

## 2011-10-28 NOTE — Progress Notes (Addendum)
[facility] NEW OB EXAMINATION   Carmen West is a 36 y.o. female, G3P2001, who presents at [redacted]w[redacted]d gestation for a new obstetrical examination. The patient had a first trimester ultrasound which confirms her gestational age.  The patient has had 2 prior cesarean sections.  Her age is greater than 35 years.  She has had a prior infant with a congenital heart defect.  The following portions of the patient's history were reviewed and updated as appropriate: allergies, current medications, past family history, past medical history, past social history, past surgical history and problem list.    Objective:    BP 98/62  Wt 128 lb (58.06 kg)  LMP 06/20/2011    Weight:  Wt Readings from Last 1 Encounters:  10/28/11 128 lb (58.06 kg)          BMI: There is no height on file to calculate BMI.  General Appearance: Alert, appropriate appearance for age. No acute distress HEENT: Grossly normal Neck / Thyroid: Supple, no masses, nodes or enlargement Lungs: clear to auscultation bilaterally Back: No CVA tenderness Breast Exam: No masses or nodes.No dimpling, nipple retraction or discharge. Cardiovascular: Regular rate and rhythm. S1, S2, no murmur Gastrointestinal: Soft, non-tender, no masses or organomegaly.                               Fundal height: 20 weeks                               Fetal heart tones audible: yes  ++++++++++++++++++++++++++++++++++++++++++++++++++++++++  Pelvic Exam: External genitalia: normal general appearance Vaginal: normal without tenderness, induration or masses and relaxation: Yes Cervix: normal appearance Adnexa: normal bimanual exam Uterus: gravid, nontender, 20 weeks size  ++++++++++++++++++++++++++++++++++++++++++++++++++++++++  Lymphatic Exam: Non-palpable nodes in neck, clavicular, axillary, or inguinal regions Neurologic: Normal speech, no tremor  Psychiatric: Alert and oriented, appropriate affect.  Ultrasound today: Single gestation, normal fluid,  normal anatomy, cervix 4.48 cm, 17 weeks and 6 days (58 percentile)  Wet Prep:   Previously done:            no                     If no: Whiff:                     Negative                              Clue cells:             no                              PH:                        4.5                              Yeast:                    no                              Trichomoniasis:  no  Urine analysis:     negative.  She is being treated for urinary tract infection.    Assessment:   36 y.o. female G3P2001 at [redacted]w[redacted]d gestation, [facility] is April 01, 2012. Normal Last menstrual period: yes Ultrasound:                               yes                                 Age greater than 35  2 prior cesarean sections  History of infant with congenital heart defect  Hypothyroidism   Plan:   We discussed routine pregnancy issues:  Toxoplasmosis was reviewed.  The patient was told to avoid cat liter boxes and feces.  The patient was told to avoid predator fish including tuna because of our concerns for mercury consumption.  The patient was told to avoid soft cheeses.  The patient was told to be sure that all lunch meats are well cooked.  Genetic screening was discussed. Yes.  Declined by patient.  Our model for pregnancy management was reviewed.  Proper diet and exercise reviewed.  Return to office in 4 weeks.  Medications include:  Prenatal vitamins Echocardiogram scheduled.  Patient given VBAC form.  She indicates that she may be interested in a repeat cesarean section.  ABC's of pregnancy given.  Mylinda Latina.D.

## 2011-10-28 NOTE — Patient Instructions (Signed)
ABCs of Pregnancy A Antepartum care is very important. Be sure you see your doctor and get prenatal care as soon as you think you are pregnant. At this time, you will be tested for infection, genetic abnormalities and potential problems with you and the pregnancy. This is the time to discuss diet, exercise, work, medications, labor, pain medication during labor and the possibility of a cesarean delivery. Ask any questions that may concern you. It is important to see your doctor regularly throughout your pregnancy. Avoid exposure to toxic substances and chemicals - such as cleaning solvents, lead and mercury, some insecticides, and paint. Pregnant women should avoid exposure to paint fumes, and fumes that cause you to feel ill, dizzy or faint. When possible, it is a good idea to have a pre-pregnancy consultation with your caregiver to begin some important recommendations your caregiver suggests such as, taking folic acid, exercising, quitting smoking, avoiding alcoholic beverages, etc. B Breastfeeding is the healthiest choice for both you and your baby. It has many nutritional benefits for the baby and health benefits for the mother. It also creates a very tight and loving bond between the baby and mother. Talk to your doctor, your family and friends, and your employer about how you choose to feed your baby and how they can support you in your decision. Not all birth defects can be prevented, but a woman can take actions that may increase her chance of having a healthy baby. Many birth defects happen very early in pregnancy, sometimes before a woman even knows she is pregnant. Birth defects or abnormalities of any child in your or the father's family should be discussed with your caregiver. Get a good support bra as your breast size changes. Wear it especially when you exercise and when nursing.  C Celebrate the news of your pregnancy with the your spouse/father and family. Childbirth classes are helpful to  take for you and the spouse/father because it helps to understand what happens during the pregnancy, labor and delivery. Cesarean delivery should be discussed with your doctor so you are prepared for that possibility. The pros and cons of circumcision if it is a boy, should be discussed with your pediatrician. Cigarette smoking during pregnancy can result in low birth weight babies. It has been associated with infertility, miscarriages, tubal pregnancies, infant death (mortality) and poor health (morbidity) in childhood. Additionally, cigarette smoking may cause long-term learning disabilities. If you smoke, you should try to quit before getting pregnant and not smoke during the pregnancy. Secondary smoke may also harm a mother and her developing baby. It is a good idea to ask people to stop smoking around you during your pregnancy and after the baby is born. Extra calcium is necessary when you are pregnant and is found in your prenatal vitamin, in dairy products, green leafy vegetables and in calcium supplements. D A healthy diet according to your current weight and height, along with vitamins and mineral supplements should be discussed with your caregiver. Domestic abuse or violence should be made known to your doctor right away to get the situation corrected. Drink more water when you exercise to keep hydrated. Discomfort of your back and legs usually develops and progresses from the middle of the second trimester through to delivery of the baby. This is because of the enlarging baby and uterus, which may also affect your balance. Do not take illegal drugs. Illegal drugs can seriously harm the baby and you. Drink extra fluids (water is best) throughout pregnancy to help   your body keep up with the increases in your blood volume. Drink at least 6 to 8 glasses of water, fruit juice, or milk each day. A good way to know you are drinking enough fluid is when your urine looks almost like clear water or is very light  yellow.  E Eat healthy to get the nutrients you and your unborn baby need. Your meals should include the five basic food groups. Exercise (30 minutes of light to moderate exercise a day) is important and encouraged during pregnancy, if there are no medical problems or problems with the pregnancy. Exercise that causes discomfort or dizziness should be stopped and reported to your caregiver. Emotions during pregnancy can change from being ecstatic to depression and should be understood by you, your partner and your family. F Fetal screening with ultrasound, amniocentesis and monitoring during pregnancy and labor is common and sometimes necessary. Take 400 micrograms of folic acid daily both before, when possible, and during the first few months of pregnancy to reduce the risk of birth defects of the brain and spine. All women who could possibly become pregnant should take a vitamin with folic acid, every day. It is also important to eat a healthy diet with fortified foods (enriched grain products, including cereals, rice, breads, and pastas) and foods with natural sources of folate (orange juice, green leafy vegetables, beans, peanuts, broccoli, asparagus, peas, and lentils). The father should be involved with all aspects of the pregnancy including, the prenatal care, childbirth classes, labor, delivery, and postpartum time. Fathers may also have emotional concerns about being a father, financial needs, and raising a family. G Genetic testing should be done appropriately. It is important to know your family and the father's history. If there have been problems with pregnancies or birth defects in your family, report these to your doctor. Also, genetic counselors can talk with you about the information you might need in making decisions about having a family. You can call a major medical center in your area for help in finding a board-certified genetic counselor. Genetic testing and counseling should be done  before pregnancy when possible, especially if there is a history of problems in the mother's or father's family. Certain ethnic backgrounds are more at risk for genetic defects. H Get familiar with the hospital where you will be having your baby. Get to know how long it takes to get there, the labor and delivery area, and the hospital procedures. Be sure your medical insurance is accepted there. Get your home ready for the baby including, clothes, the baby's room (when possible), furniture and car seat. Hand washing is important throughout the day, especially after handling raw meat and poultry, changing the baby's diaper or using the bathroom. This can help prevent the spread of many bacteria and viruses that cause infection. Your hair may become dry and thinner, but will return to normal a few weeks after the baby is born. Heartburn is a common problem that can be treated by taking antacids recommended by your caregiver, eating smaller meals 5 or 6 times a day, not drinking liquids when eating, drinking between meals and raising the head of your bed 2 to 3 inches. I Insurance to cover you, the baby, doctor and hospital should be reviewed so that you will be prepared to pay any costs not covered by your insurance plan. If you do not have medical insurance, there are usually clinics and services available for you in your community. Take 30 milligrams of iron during   your pregnancy as prescribed by your doctor to reduce the risk of low red blood cells (anemia) later in pregnancy. All women of childbearing age should eat a diet rich in iron. J There should be a joint effort for the mother, father and any other children to adapt to the pregnancy financially, emotionally, and psychologically during the pregnancy. Join a support group for moms-to-be. Or, join a class on parenting or childbirth. Have the family participate when possible. K Know your limits. Let your caregiver know if you experience any of the  following:   Pain of any kind.   Strong cramps.   You develop a lot of weight in a short period of time (5 pounds in 3 to 5 days).   Vaginal bleeding, leaking of amniotic fluid.   Headache, vision problems.   Dizziness, fainting, shortness of breath.   Chest pain.   Fever of 102 F (38.9 C) or higher.   Gush of clear fluid from your vagina.   Painful urination.   Domestic violence.   Irregular heartbeat (palpitations).   Rapid beating of the heart (tachycardia).   Constant feeling sick to your stomach (nauseous) and vomiting.   Trouble walking, fluid retention (edema).   Muscle weakness.   If your baby has decreased activity.   Persistent diarrhea.   Abnormal vaginal discharge.   Uterine contractions at 20-minute intervals.   Back pain that travels down your leg.  L Learn and practice that what you eat and drink should be in moderation and healthy for you and your baby. Legal drugs such as alcohol and caffeine are important issues for pregnant women. There is no safe amount of alcohol a woman can drink while pregnant. Fetal alcohol syndrome, a disorder characterized by growth retardation, facial abnormalities, and central nervous system dysfunction, is caused by a woman's use of alcohol during pregnancy. Caffeine, found in tea, coffee, soft drinks and chocolate, should also be limited. Be sure to read labels when trying to cut down on caffeine during pregnancy. More than 200 foods, beverages, and over-the-counter medications contain caffeine and have a high salt content! There are coffees and teas that do not contain caffeine. M Medical conditions such as diabetes, epilepsy, and high blood pressure should be treated and kept under control before pregnancy when possible, but especially during pregnancy. Ask your caregiver about any medications that may need to be changed or adjusted during pregnancy. If you are currently taking any medications, ask your caregiver if it  is safe to take them while you are pregnant or before getting pregnant when possible. Also, be sure to discuss any herbs or vitamins you are taking. They are medicines, too! Discuss with your doctor all medications, prescribed and over-the-counter, that you are taking. During your prenatal visit, discuss the medications your doctor may give you during labor and delivery. N Never be afraid to ask your doctor or caregiver questions about your health, the progress of the pregnancy, family problems, stressful situations, and recommendation for a pediatrician, if you do not have one. It is better to take all precautions and discuss any questions or concerns you may have during your office visits. It is a good idea to write down your questions before you visit the doctor. O Over-the-counter cough and cold remedies may contain alcohol or other ingredients that should be avoided during pregnancy. Ask your caregiver about prescription, herbs or over-the-counter medications that you are taking or may consider taking while pregnant.  P Physical activity during pregnancy can   benefit both you and your baby by lessening discomfort and fatigue, providing a sense of well-being, and increasing the likelihood of early recovery after delivery. Light to moderate exercise during pregnancy strengthens the belly (abdominal) and back muscles. This helps improve posture. Practicing yoga, walking, swimming, and cycling on a stationary bicycle are usually safe exercises for pregnant women. Avoid scuba diving, exercise at high altitudes (over 3000 feet), skiing, horseback riding, contact sports, etc. Always check with your doctor before beginning any kind of exercise, especially during pregnancy and especially if you did not exercise before getting pregnant. Q Queasiness, stomach upset and morning sickness are common during pregnancy. Eating a couple of crackers or dry toast before getting out of bed. Foods that you normally love may  make you feel sick to your stomach. You may need to substitute other nutritious foods. Eating 5 or 6 small meals a day instead of 3 large ones may make you feel better. Do not drink with your meals, drink between meals. Questions that you have should be written down and asked during your prenatal visits. R Read about and make plans to baby-proof your home. There are important tips for making your home a safer environment for your baby. Review the tips and make your home safer for you and your baby. Read food labels regarding calories, salt and fat content in the food. S Saunas, hot tubs, and steam rooms should be avoided while you are pregnant. Excessive high heat may be harmful during your pregnancy. Your caregiver will screen and examine you for sexually transmitted diseases and genetic disorders during your prenatal visits. Learn the signs of labor. Sexual relations while pregnant is safe unless there is a medical or pregnancy problem and your caregiver advises against it. T Traveling long distances should be avoided especially in the third trimester of your pregnancy. If you do have to travel out of state, be sure to take a copy of your medical records and medical insurance plan with you. You should not travel long distances without seeing your doctor first. Most airlines will not allow you to travel after 36 weeks of pregnancy. Toxoplasmosis is an infection caused by a parasite that can seriously harm an unborn baby. Avoid eating undercooked meat and handling cat litter. Be sure to wear gloves when gardening. Tingling of the hands and fingers is not unusual and is due to fluid retention. This will go away after the baby is born. U Womb (uterus) size increases during the first trimester. Your kidneys will begin to function more efficiently. This may cause you to feel the need to urinate more often. You may also leak urine when sneezing, coughing or laughing. This is due to the growing uterus pressing  against your bladder, which lies directly in front of and slightly under the uterus during the first few months of pregnancy. If you experience burning along with frequency of urination or bloody urine, be sure to tell your doctor. The size of your uterus in the third trimester may cause a problem with your balance. It is advisable to maintain good posture and avoid wearing high heels during this time. An ultrasound of your baby may be necessary during your pregnancy and is safe for you and your baby. V Vaccinations are an important concern for pregnant women. Get needed vaccines before pregnancy. Center for Disease Control (www.cdc.gov) has clear guidelines for the use of vaccines during pregnancy. Review the list, be sure to discuss it with your doctor. Prenatal vitamins are helpful   and healthy for you and the baby. Do not take extra vitamins except what is recommended. Taking too much of certain vitamins can cause overdose problems. Continuous vomiting should be reported to your caregiver. Varicose veins may appear especially if there is a family history of varicose veins. They should subside after the delivery of the baby. Support hose helps if there is leg discomfort. W Being overweight or underweight during pregnancy may cause problems. Try to get within 15 pounds of your ideal weight before pregnancy. Remember, pregnancy is not a time to be dieting! Do not stop eating or start skipping meals as your weight increases. Both you and your baby need the calories and nutrition you receive from a healthy diet. Be sure to consult with your doctor about your diet. There is a formula and diet plan available depending on whether you are overweight or underweight. Your caregiver or nutritionist can help and advise you if necessary. X Avoid X-rays. If you must have dental work or diagnostic tests, tell your dentist or physician that you are pregnant so that extra care can be taken. X-rays should only be taken when  the risks of not taking them outweigh the risk of taking them. If needed, only the minimum amount of radiation should be used. When X-rays are necessary, protective lead shields should be used to cover areas of the body that are not being X-rayed. Y Your baby loves you. Breastfeeding your baby creates a loving and very close bond between the two of you. Give your baby a healthy environment to live in while you are pregnant. Infants and children require constant care and guidance. Their health and safety should be carefully watched at all times. After the baby is born, rest or take a nap when the baby is sleeping. Z Get your ZZZs. Be sure to get plenty of rest. Resting on your side as often as possible, especially on your left side is advised. It provides the best circulation to your baby and helps reduce swelling. Try taking a nap for 30 to 45 minutes in the afternoon when possible. After the baby is born rest or take a nap when the baby is sleeping. Try elevating your feet for that amount of time when possible. It helps the circulation in your legs and helps reduce swelling.  Most information courtesy of the CDC. Document Released: 04/01/2005 Document Revised: 03/21/2011 Document Reviewed: 12/14/2008 [employer] Patient Information 2012 [employer]. 

## 2011-10-29 ENCOUNTER — Other Ambulatory Visit: Payer: Self-pay | Admitting: Obstetrics and Gynecology

## 2011-10-29 DIAGNOSIS — R300 Dysuria: Secondary | ICD-10-CM

## 2011-10-29 LAB — US OB COMP + 14 WK

## 2011-10-29 LAB — URINE CULTURE
Colony Count: NO GROWTH
Culture: NO GROWTH

## 2011-10-30 ENCOUNTER — Other Ambulatory Visit: Payer: Self-pay | Admitting: Obstetrics and Gynecology

## 2011-10-30 ENCOUNTER — Telehealth: Payer: Self-pay | Admitting: Obstetrics and Gynecology

## 2011-10-30 DIAGNOSIS — R300 Dysuria: Secondary | ICD-10-CM

## 2011-10-30 MED ORDER — PHENAZOPYRIDINE HCL 100 MG PO TABS: 100.0000 mg | ORAL_TABLET | Freq: Three times a day (TID) | ORAL | Status: AC | PRN

## 2011-10-30 NOTE — Telephone Encounter (Signed)
TC from pt. States continues to have same dysuria after voiding that had 10/27/11 when seen in MAU.  Has been taking Macrobid.  Drinking only 48 oz water per day.   Consult with VL who reviewed urine results.  Suggested pt may be having bladder spasms.  Advised Rx for Pyridium and increased water.  Pt is agreeable.  To call with no improvement. Pt verbalizes comprehension.

## 2011-10-31 ENCOUNTER — Telehealth: Payer: Self-pay

## 2011-10-31 LAB — PAP IG W/ RFLX HPV ASCU

## 2011-10-31 NOTE — Telephone Encounter (Signed)
Message copied by Rolla Plate on Thu Oct 31, 2011 10:47 AM ------      Message from: Jaymes Graff      Created: Thu Oct 31, 2011 12:49 AM       Patient has a UTI.  Please prescribe macrobid 1 tablet twice a day for seven days.  DISP # 14.  No refills.  Please recheck to make sure patient has no allergy to Macrobid.  Thank you

## 2011-11-02 NOTE — Progress Notes (Signed)
Quick Note:  Send atypical Pap smear letter with a note to return for repeat Pap in six months. ______ 

## 2011-11-05 ENCOUNTER — Encounter: Payer: Self-pay | Admitting: Obstetrics and Gynecology

## 2011-11-05 ENCOUNTER — Ambulatory Visit: Payer: Medicaid Other

## 2011-11-10 ENCOUNTER — Encounter (HOSPITAL_COMMUNITY): Payer: Self-pay | Admitting: Obstetrics and Gynecology

## 2011-11-10 ENCOUNTER — Inpatient Hospital Stay (HOSPITAL_COMMUNITY)
Admission: AD | Admit: 2011-11-10 | Discharge: 2011-11-10 | Disposition: A | Discharge status: Home / Self Care | Payer: Medicaid Other | Source: Ambulatory Visit | Attending: Obstetrics and Gynecology | Admitting: Obstetrics and Gynecology

## 2011-11-10 DIAGNOSIS — Z98891 History of uterine scar from previous surgery: Secondary | ICD-10-CM

## 2011-11-10 DIAGNOSIS — Z331 Pregnant state, incidental: Secondary | ICD-10-CM

## 2011-11-10 DIAGNOSIS — R000 Tachycardia, unspecified: Secondary | ICD-10-CM

## 2011-11-10 DIAGNOSIS — N390 Urinary tract infection, site not specified: Secondary | ICD-10-CM

## 2011-11-10 DIAGNOSIS — R002 Palpitations: Secondary | ICD-10-CM | POA: Insufficient documentation

## 2011-11-10 DIAGNOSIS — O09529 Supervision of elderly multigravida, unspecified trimester: Secondary | ICD-10-CM

## 2011-11-10 DIAGNOSIS — O99891 Other specified diseases and conditions complicating pregnancy: Secondary | ICD-10-CM | POA: Insufficient documentation

## 2011-11-10 DIAGNOSIS — O09299 Supervision of pregnancy with other poor reproductive or obstetric history, unspecified trimester: Secondary | ICD-10-CM

## 2011-11-10 DIAGNOSIS — E039 Hypothyroidism, unspecified: Secondary | ICD-10-CM

## 2011-11-10 LAB — URINALYSIS, ROUTINE W REFLEX MICROSCOPIC
Glucose, UA: NEGATIVE mg/dL
Hgb urine dipstick: NEGATIVE
Leukocytes, UA: NEGATIVE
Specific Gravity, Urine: <1.005 — ABNORMAL LOW (ref 1.005–1.030)
pH: 6.5 (ref 5.0–8.0)

## 2011-11-10 NOTE — MAU Provider Note (Signed)
History     CSN: [id number]  Arrival date and time: 11/10/11 1350   First Provider Initiated Contact with Patient 11/10/11 1430      Chief Complaint  Patient presents with  . Tachycardia   HPI Comments: Pt is a G3P2 at [redacted]w[redacted]d that arrives unannounced c/o racing heart rate that happened about noon, is better now. Denies any c/o now, no SOB, or chest pain. Denies any abd pain, no VB, or LOF.  Pt is on synthroid, has f/u w endocrine next week. Denies any previous cardiac issues. Denies any syncope  Pt also concerned because she got abnormal pap smear letter (ASC-US), rv'd w pt and will f/u pap in 6 mos. (postpartum)      Past Medical History  Diagnosis Date  . Thyroid disease   . Hypothyroidism 2009    DR JAEJALI  . Depression     AFTER LOSS OF BABY;  NO MEDS  . Shortness of breath     DURING SLEEP 3-4 X MONTH  . Infection   . Headache, migraine     IMPROVED  . UTI (urinary tract infection) following delivery     Past Surgical History  Procedure Date  . Cesarean section     Family History  Problem Relation Age of Onset  . Kidney disease Father     STONES  . Diabetes Maternal Uncle   . Congenital heart disease Daughter     died 88 days of age  . Birth defects Daughter     HEART DEFECT  . Hypertension Mother   . Arthritis Mother   . Heart disease Mother   . Hypothyroidism Mother   . Hypothyroidism Sister   . Birth defects Cousin     HYDOCEPHALIC    History  Substance Use Topics  . Smoking status: Never Smoker   . Smokeless tobacco: Never Used  . Alcohol Use: No    Allergies: No Known Allergies  Prescriptions prior to admission  Medication Sig Dispense Refill  . levothyroxine (SYNTHROID, LEVOTHROID) 150 MCG tablet Take 150 mcg by mouth daily.      . Prenatal Vit-Fe Fumarate-FA (PRENATAL MULTIVITAMIN) TABS Take 1 tablet by mouth daily.        Review of Systems  All other systems reviewed and are negative.   Physical Exam   Blood pressure 123/70,  pulse 110, temperature 98.4 F (36.9 C), temperature source Axillary, resp. rate 18, height 5' 0.5" (1.537 m), weight 131 lb 6.4 oz (59.603 kg), last menstrual period 06/20/2011.  Physical Exam  Nursing note and vitals reviewed. Constitutional: She is oriented to person, place, and time. She appears well-developed and well-nourished. No distress.  HENT:  Head: Normocephalic.  Eyes: Pupils are equal, round, and reactive to light.  Neck: Normal range of motion.  Cardiovascular: Regular rhythm and normal heart sounds.        Slight tachycardia noted   Respiratory: Effort normal and breath sounds normal.  GI: Soft. Bowel sounds are normal.  Musculoskeletal: Normal range of motion. She exhibits no edema.  Neurological: She is alert and oriented to person, place, and time.  Skin: Skin is warm and dry.  Psychiatric: She has a normal mood and affect. Her behavior is normal.    MAU Course  Procedures    Assessment and Plan  IUP at [redacted]w[redacted]d Resolved symptoms of palpitations Slight tachycardia   Pt has f/u endocrine next week Will refer cardiology if sx's don't resolve Will check UA and 02 sats   Thailyn Khalid  M 11/10/2011, 2:37 PM   Addendum:  HR now 82, 02sat 99% Reassured pt Pt again asking about abnormal pap smear, crying and worried it will affect baby Reassured pt D/c home stable condition, f/u as needed

## 2011-11-10 NOTE — MAU Note (Signed)
Pt reports she had and episode to day that heart was beating fast and she felt SOB. Feels better now but also reporting abd pressure. Pt stated she has hyperthyroid so maybe that is why she has the fast heartbeat.

## 2011-11-11 ENCOUNTER — Telehealth: Payer: Self-pay | Admitting: Obstetrics and Gynecology

## 2011-11-11 NOTE — Telephone Encounter (Signed)
Pt presented to office.  Is very anxious. about Pap results.  Reviewed results including negative for High Rish HPV. Advised will continue to follow.  Reassurance given.

## 2011-11-11 NOTE — Telephone Encounter (Signed)
TC to pt. LM to return call.  

## 2011-11-14 ENCOUNTER — Telehealth: Payer: Self-pay | Admitting: Obstetrics and Gynecology

## 2011-11-14 ENCOUNTER — Encounter: Payer: Self-pay | Admitting: Obstetrics and Gynecology

## 2011-11-14 NOTE — Progress Notes (Unsigned)
Pt came in office today to let Harriett Sine know her fetal echo was good.  Pt stopped me in hall ask me to take her BP stated it was a little elevated at fetal echo appt.  BP was 128/54.  Pt stated feels fine just concerned that it was elevated earlier in the day.

## 2011-11-14 NOTE — Progress Notes (Signed)
Pt presented to office to inform nurse that fetal echo was normal. States BP was a little high at visit but was stressed. Is very happy now. To keep appt as sheduled. Pt agreeable.

## 2011-11-14 NOTE — Telephone Encounter (Signed)
Triage/epic 

## 2011-11-15 ENCOUNTER — Inpatient Hospital Stay (HOSPITAL_COMMUNITY)
Admission: AD | Admit: 2011-11-15 | Discharge: 2011-11-15 | Disposition: A | Discharge status: Home / Self Care | Payer: Medicaid Other | Source: Ambulatory Visit | Attending: Obstetrics and Gynecology | Admitting: Obstetrics and Gynecology

## 2011-11-15 ENCOUNTER — Other Ambulatory Visit (HOSPITAL_COMMUNITY): Payer: Self-pay | Admitting: *Deleted

## 2011-11-15 DIAGNOSIS — Z1329 Encounter for screening for other suspected endocrine disorder: Secondary | ICD-10-CM | POA: Insufficient documentation

## 2011-11-15 DIAGNOSIS — O99891 Other specified diseases and conditions complicating pregnancy: Secondary | ICD-10-CM | POA: Insufficient documentation

## 2011-11-15 LAB — T4, FREE: Free T4: 1.19 ng/dL (ref 0.80–1.80)

## 2011-11-15 LAB — TSH: TSH: 1.497 u[IU]/mL (ref 0.350–4.500)

## 2011-11-15 NOTE — MAU Note (Signed)
This pt was suppose to be OP labs only.  She has MD in [place], [place],  [facility].  He ordered labs TSH per patient and today we got [facility] to cover the order as well as fasting Cholesterol and glucose for Saturday (as OP lab, not MAU visit).  I have called the office today and left message.  Will discuss with office of Dr. Buren Kos how to obtain results on Monday. Pt is patient of [facility] Nicki Reaper, DD

## 2011-11-16 ENCOUNTER — Ambulatory Visit (HOSPITAL_COMMUNITY)
Admit: 2011-11-16 | Discharge: 2011-11-16 | Disposition: A | Discharge status: Home / Self Care | Payer: Medicaid Other | Attending: Obstetrics and Gynecology | Admitting: Obstetrics and Gynecology

## 2011-11-16 ENCOUNTER — Other Ambulatory Visit: Payer: Self-pay | Admitting: Obstetrics and Gynecology

## 2011-11-16 DIAGNOSIS — Z3689 Encounter for other specified antenatal screening: Secondary | ICD-10-CM | POA: Insufficient documentation

## 2011-11-16 LAB — GLUCOSE, RANDOM: Glucose, Bld: 92 mg/dL (ref 70–99)

## 2011-11-16 LAB — CHOLESTEROL, TOTAL: Cholesterol: 250 mg/dL — ABNORMAL HIGH (ref 0–200)

## 2011-11-16 LAB — FERRITIN: Ferritin: 11 ng/mL (ref 10–291)

## 2011-11-18 ENCOUNTER — Encounter: Payer: Self-pay | Admitting: Obstetrics and Gynecology

## 2011-11-18 ENCOUNTER — Telehealth: Payer: Self-pay | Admitting: Obstetrics and Gynecology

## 2011-11-18 NOTE — Telephone Encounter (Signed)
Results faxed. Per note.

## 2011-11-18 NOTE — Telephone Encounter (Signed)
Per Vl, informed of lab results. Pt will call with fax number for [facility] to fax results.

## 2011-11-18 NOTE — Progress Notes (Signed)
Per [facility] the following labs were done at [facility]. Results faxed to [facility].  TSH 1.497.   Free T4 1.19.   Cholesterol 250.   Glucose 102.   Ferritin 11. Pt is aware.

## 2011-11-25 ENCOUNTER — Encounter: Payer: Medicaid Other | Admitting: Obstetrics and Gynecology

## 2011-11-26 ENCOUNTER — Ambulatory Visit (INDEPENDENT_AMBULATORY_CARE_PROVIDER_SITE_OTHER): Payer: Medicaid Other | Admitting: Obstetrics and Gynecology

## 2011-11-26 ENCOUNTER — Encounter: Payer: Self-pay | Admitting: Obstetrics and Gynecology

## 2011-11-26 VITALS — BP 100/60 | Wt 133.0 lb

## 2011-11-26 DIAGNOSIS — Z331 Pregnant state, incidental: Secondary | ICD-10-CM

## 2011-11-26 NOTE — Patient Instructions (Signed)
Abnormal Pap Test WHAT DOES A PAP TEST CHECK FOR? A Pap test checks the cells in the cervix for any infections or changes that may turn into cancer. The cells are checked to see if they look normal or if they show any changes (abnormal). Abnormal changes may be a sign of problems with your cervix. Cervical cells that are abnormal are called cervical dysplasia. Dysplasia is not cancer. It is a pre-cancerous change in the cells of your cervix. WHAT DOES AN ABNORMAL PAP TEST MEAN? Most times, an abnormal Pap test does not mean you have cancer. However, it does show that there may be a problem. Your doctor will want to do other tests to find out more about the abnormal cells.  Your abnormal Pap test results could show:  Small changes that should be carefully watched.   Dysplasia that could grow into cancer.   Cancer.  When dysplasia is found and treated early, it most often does not grow into cancer. WHAT WILL BE DONE ABOUT MY ABNORMAL PAP TEST? You may have:  A colposcopy test done. Your cevix will be looked at using a strong light and a microscope.   A cone biopsy. A small, cone-shaped sample of your cervix is taken out. The part that is taken out is the area where the abnormal cells are.   Cryosurgery. The abnormal cells on your cervix will be frozen.   Loop Electrical Excision Procedure (LEEP). The abnormal cells will be taken out.  WHAT IF I HAVE A DYSPLASIA OR A CANCER? You and your doctor may choose a hysterectomy as the best treatment for you. During this surgery, your womb (uterus) and your cervix will be taken out. WHAT SHOULD YOU DO AFTER BEING TREATED? Keep having Pap tests and checkups as often as your doctor tells you. Your cervical problem will be carefully watched so it does not get worse. Also, your doctor can watch for, and treat, any new problems that may come up. Document Released: 07/17/2010 Document Revised: 03/21/2011 Document Reviewed: 03/03/2011 [employer] Patient  Information 2012 [employer]. 

## 2011-11-26 NOTE — Progress Notes (Signed)
Pt c/o of right groin pain.  No LOF or VB 1. Round ligament pain recommend supportrive measures 2. ASCUS pos HRHPV recommend pap in one year.  Several questions answered 3. Fetal echo WNL 4. First trimester screen WNL.  Check AFP

## 2011-11-29 LAB — ALPHA FETOPROTEIN, MATERNAL
MoM for AFP: 0.85
Open Spina bifida: NEGATIVE
Osb Risk: 1:22800 {titer}

## 2011-12-04 ENCOUNTER — Telehealth: Payer: Self-pay | Admitting: Obstetrics and Gynecology

## 2011-12-04 NOTE — Telephone Encounter (Signed)
TRIAGE/ TST RES °

## 2011-12-04 NOTE — Telephone Encounter (Signed)
Spoke with pt rgd message. Pt stated that she wants her test results from 11/26/2011. Told pt that her test for spinal bifida was neg. Pt's voice understanding . bt cma

## 2011-12-05 ENCOUNTER — Telehealth: Payer: Self-pay

## 2011-12-05 ENCOUNTER — Encounter: Payer: Self-pay | Admitting: Obstetrics and Gynecology

## 2011-12-05 NOTE — Telephone Encounter (Signed)
Pt c/o occ dizziness. No headaches or visual changes. Pt has only had a banana today. BP-120/56. Informed pt to eat 6-8 small frequent meals daily and increase water intake(64oz) daily. Pt to cb if no improvement. Pt agrees.

## 2011-12-15 ENCOUNTER — Encounter (HOSPITAL_COMMUNITY): Payer: Self-pay | Admitting: *Deleted

## 2011-12-15 ENCOUNTER — Inpatient Hospital Stay (HOSPITAL_COMMUNITY)
Admission: AD | Admit: 2011-12-15 | Discharge: 2011-12-15 | Disposition: A | Discharge status: Home / Self Care | Payer: Medicaid Other | Source: Ambulatory Visit | Attending: Obstetrics and Gynecology | Admitting: Obstetrics and Gynecology

## 2011-12-15 DIAGNOSIS — Z331 Pregnant state, incidental: Secondary | ICD-10-CM

## 2011-12-15 DIAGNOSIS — G44209 Tension-type headache, unspecified, not intractable: Secondary | ICD-10-CM

## 2011-12-15 DIAGNOSIS — O99891 Other specified diseases and conditions complicating pregnancy: Secondary | ICD-10-CM | POA: Insufficient documentation

## 2011-12-15 DIAGNOSIS — R51 Headache: Secondary | ICD-10-CM

## 2011-12-15 LAB — URINALYSIS, ROUTINE W REFLEX MICROSCOPIC
Bilirubin Urine: NEGATIVE
Glucose, UA: 250 mg/dL — AB
Nitrite: NEGATIVE
Specific Gravity, Urine: 1.01 (ref 1.005–1.030)
pH: 7.5 (ref 5.0–8.0)

## 2011-12-15 MED ORDER — ACETAMINOPHEN 325 MG PO TABS
650.0000 mg | ORAL_TABLET | Freq: Once | ORAL | Status: AC
  Administered 2011-12-15: 650 mg via ORAL
  Filled 2011-12-15: qty 2

## 2011-12-15 MED ORDER — OXYCODONE-ACETAMINOPHEN 5-325 MG PO TABS: 1.0000 | ORAL_TABLET | ORAL | Status: AC | PRN

## 2011-12-15 NOTE — Progress Notes (Signed)
History  Carmen West is a 36 y.o. G3P2001 at [redacted]w[redacted]d   Subjective: c/o headache to back of head, shoulders every 2 months or so since baby died, drinks little water, afraid to take tylenol. Has been to chiropractor using peppermint oil on temples feels better then. No swelling, with +FM, no uc, srom, or vag bleeding, describes sharp pain RLQ at times infrequent    Chief Complaint  Patient presents with  . Headache   @SFHPI @  Vitals:  Blood pressure 106/58, pulse 117, temperature 99 F (37.2 C), temperature source Oral, resp. rate 20, height 5' (1.524 m), weight 136 lb 6.4 oz (61.871 kg), last menstrual period 06/20/2011, SpO2 100.00%. OB History    Grav Para Term Preterm Abortions TAB SAB Ect Mult Living   3 2 2       1       Past Medical History  Diagnosis Date  . Thyroid disease   . Hypothyroidism 2009    DR JAEJALI  . Depression     AFTER LOSS OF BABY;  NO MEDS  . Shortness of breath     DURING SLEEP 3-4 X MONTH  . Infection   . Headache, migraine     IMPROVED  . UTI (urinary tract infection) following delivery     Past Surgical History  Procedure Date  . Cesarean section     Family History  Problem Relation Age of Onset  . Kidney disease Father     STONES  . Diabetes Maternal Uncle   . Congenital heart disease Daughter     died 74 days of age  . Birth defects Daughter     HEART DEFECT  . Hypertension Mother   . Arthritis Mother   . Heart disease Mother   . Hypothyroidism Mother   . Hypothyroidism Sister   . Birth defects Cousin     HYDOCEPHALIC    History  Substance Use Topics  . Smoking status: Never Smoker   . Smokeless tobacco: Never Used  . Alcohol Use: No    Allergies: No Known Allergies  Prescriptions prior to admission  Medication Sig Dispense Refill  . levothyroxine (SYNTHROID, LEVOTHROID) 150 MCG tablet Take 150 mcg by mouth daily.      . Prenatal Vit-Fe Fumarate-FA (PRENATAL MULTIVITAMIN) TABS Take 1 tablet by mouth daily.         @ROS @ Physical Exam   Blood pressure 106/58, pulse 117, temperature 99 F (37.2 C), temperature source Oral, resp. rate 20, height 5' (1.524 m), weight 136 lb 6.4 oz (61.871 kg), last menstrual period 06/20/2011, SpO2 100.00%.  @PHYSEXAMBYAGE2 @ Labs:  Recent Results (from the past 24 hour(s))  URINALYSIS, ROUTINE W REFLEX MICROSCOPIC   Collection Time   12/15/11 12:30 PM      Component Value Range   Color, Urine YELLOW  YELLOW   APPearance CLEAR  CLEAR   Specific Gravity, Urine 1.010  1.005 - 1.030   pH 7.5  5.0 - 8.0   Glucose, UA 250 (*) NEGATIVE mg/dL   Hgb urine dipstick NEGATIVE  NEGATIVE   Bilirubin Urine NEGATIVE  NEGATIVE   Ketones, ur NEGATIVE  NEGATIVE mg/dL   Protein, ur NEGATIVE  NEGATIVE mg/dL   Urobilinogen, UA 0.2  0.0 - 1.0 mg/dL   Nitrite NEGATIVE  NEGATIVE   Leukocytes, UA NEGATIVE  NEGATIVE   Results for orders placed during the hospital encounter of 12/15/11 (from the past 24 hour(s))  URINALYSIS, ROUTINE W REFLEX MICROSCOPIC     Status: Abnormal  Collection Time   12/15/11 12:30 PM      Component Value Range   Color, Urine YELLOW  YELLOW   APPearance CLEAR  CLEAR   Specific Gravity, Urine 1.010  1.005 - 1.030   pH 7.5  5.0 - 8.0   Glucose, UA 250 (*) NEGATIVE mg/dL   Hgb urine dipstick NEGATIVE  NEGATIVE   Bilirubin Urine NEGATIVE  NEGATIVE   Ketones, ur NEGATIVE  NEGATIVE mg/dL   Protein, ur NEGATIVE  NEGATIVE mg/dL   Urobilinogen, UA 0.2  0.0 - 1.0 mg/dL   Nitrite NEGATIVE  NEGATIVE   Leukocytes, UA NEGATIVE  NEGATIVE   ASSESSMENT: Patient Active Problem List  Diagnosis  . Amenorrhea  . Family history of congenital heart defect  . AMA (advanced maternal age) multigravida 35+  . Previous pregnancy with congenital heart defect, currently pregnant  . UTI (lower urinary tract infection)  . Hypothyroidism  . History of cesarean delivery   Physical Examination:  General appearance - alert, well appearing, and in no  distress nontender Fundal height 24 No edema to lower extremities  FHTS + UC without ED Course  [redacted]w[redacted]d Tension headache Assessment/Plan Discussed tylenol, 8 water daily, meals, snacks, back and neck exercises reviewed, prenatal yoga, ice to neck, shoulders, f/o as scheduled, percocet rx if needed given f/o as scheduled. Lavera Guise, CNM

## 2011-12-15 NOTE — MAU Note (Signed)
Pt reports has had headache since yesterday afternoon. Not taken anything for it. Also reports feels like heart is racing and a little SOB.

## 2011-12-18 ENCOUNTER — Ambulatory Visit: Payer: Medicaid Other | Attending: Obstetrics and Gynecology

## 2011-12-18 DIAGNOSIS — R5381 Other malaise: Secondary | ICD-10-CM | POA: Insufficient documentation

## 2011-12-18 DIAGNOSIS — M545 Low back pain, unspecified: Secondary | ICD-10-CM | POA: Insufficient documentation

## 2011-12-18 DIAGNOSIS — IMO0001 Reserved for inherently not codable concepts without codable children: Secondary | ICD-10-CM | POA: Insufficient documentation

## 2011-12-19 ENCOUNTER — Other Ambulatory Visit: Payer: Medicaid Other

## 2011-12-19 ENCOUNTER — Telehealth: Payer: Self-pay

## 2011-12-19 DIAGNOSIS — Z331 Pregnant state, incidental: Secondary | ICD-10-CM

## 2011-12-19 DIAGNOSIS — R81 Glycosuria: Secondary | ICD-10-CM

## 2011-12-19 NOTE — Progress Notes (Unsigned)
Pt here for glucola only urine n/n glucola due at 1:32

## 2011-12-19 NOTE — Telephone Encounter (Signed)
Message copied by Rolla Plate on Thu Dec 19, 2011  8:16 AM ------      Message from: Jaymes Graff      Created: Wed Dec 18, 2011 11:05 PM       If she does not have DM then I would like her to come in for a glucola only      ----- Message -----         From: Claudie Leach, CMA         Sent: 12/18/2011  10:45 AM           To: Michael Litter, MD            Pt has appt 12/26/11 1:30 with sr      ----- Message -----         From: Michael Litter, MD         Sent: 12/17/2011  10:11 PM           To: Claudie Leach, CMA            Pt spilled glucose in her urine at MAU.  Please have her come in for a glucola ASAP.  thanks

## 2011-12-19 NOTE — Telephone Encounter (Signed)
Lm on vm tcb rgd appt 

## 2011-12-19 NOTE — Telephone Encounter (Signed)
Spoke with pt rgd labs informed pt she spilled glucose in her urine while at MAU need Glucola per ND pt has appt 12/19/11 at 11:45 pt voice understanding

## 2011-12-20 ENCOUNTER — Telehealth: Payer: Self-pay | Admitting: Obstetrics and Gynecology

## 2011-12-20 NOTE — Telephone Encounter (Signed)
KAREN/LAB RES.

## 2011-12-23 ENCOUNTER — Other Ambulatory Visit: Payer: Self-pay | Admitting: Obstetrics and Gynecology

## 2011-12-23 ENCOUNTER — Telehealth: Payer: Self-pay | Admitting: Obstetrics and Gynecology

## 2011-12-23 DIAGNOSIS — O99810 Abnormal glucose complicating pregnancy: Secondary | ICD-10-CM

## 2011-12-23 NOTE — Telephone Encounter (Signed)
TC from pt. Reviewed 3h GTT diet and answered questions.

## 2011-12-23 NOTE — Progress Notes (Signed)
Pt notified of elevated 1 hr GTT.  Sch pt for 3 hr GTT on 12-25-2011 @ 8:30.  Pt pickup diet and appt info.

## 2011-12-25 ENCOUNTER — Ambulatory Visit: Payer: Medicaid Other | Admitting: Physical Therapy

## 2011-12-26 ENCOUNTER — Ambulatory Visit: Payer: Medicaid Other | Admitting: Physical Therapy

## 2011-12-26 ENCOUNTER — Ambulatory Visit (INDEPENDENT_AMBULATORY_CARE_PROVIDER_SITE_OTHER): Payer: Medicaid Other | Admitting: Obstetrics and Gynecology

## 2011-12-26 VITALS — BP 110/58 | Wt 138.0 lb

## 2011-12-26 DIAGNOSIS — Z331 Pregnant state, incidental: Secondary | ICD-10-CM

## 2011-12-26 DIAGNOSIS — Z9889 Other specified postprocedural states: Secondary | ICD-10-CM

## 2011-12-26 DIAGNOSIS — Z98891 History of uterine scar from previous surgery: Secondary | ICD-10-CM

## 2011-12-26 LAB — GLUCOSE TOLERANCE, 3 HOURS
Glucose Tolerance, 1 hour: 122 mg/dL (ref 70–189)
Glucose Tolerance, 2 hour: 84 mg/dL (ref 70–164)
Glucose Tolerance, Fasting: 91 mg/dL (ref 70–104)

## 2011-12-26 NOTE — Progress Notes (Signed)
3 hr GTT on 12-25-11 "WNL"  Pt wants to discuss 3 hr.

## 2011-12-26 NOTE — Progress Notes (Signed)
[redacted]w[redacted]d GFM Questions answered on normal 3 hr GTT and ASCUS without HPV: recommend repeat Pap in 1 year

## 2011-12-27 ENCOUNTER — Telehealth: Payer: Self-pay | Admitting: Obstetrics and Gynecology

## 2011-12-27 ENCOUNTER — Other Ambulatory Visit: Payer: Self-pay | Admitting: Obstetrics and Gynecology

## 2011-12-27 ENCOUNTER — Telehealth: Payer: Self-pay | Admitting: Internal Medicine

## 2011-12-27 NOTE — Telephone Encounter (Signed)
Repeat C/S scheduled for 03/30/12 @ 9:30 with SR. -Adrianne Pridgen

## 2012-01-02 ENCOUNTER — Ambulatory Visit: Payer: Medicaid Other | Admitting: Physical Therapy

## 2012-01-08 ENCOUNTER — Ambulatory Visit: Payer: Medicaid Other | Admitting: Physical Therapy

## 2012-01-09 ENCOUNTER — Ambulatory Visit (INDEPENDENT_AMBULATORY_CARE_PROVIDER_SITE_OTHER): Payer: Medicaid Other | Admitting: Obstetrics and Gynecology

## 2012-01-09 VITALS — BP 104/62 | Wt 140.0 lb

## 2012-01-09 DIAGNOSIS — Z331 Pregnant state, incidental: Secondary | ICD-10-CM

## 2012-01-09 NOTE — Progress Notes (Signed)
Pt stated having some pain around her c-section scar. C/o headaches every 10 day. Pt stated no other issues  Today.

## 2012-01-09 NOTE — Progress Notes (Signed)
[redacted]w[redacted]d  GFM  occ ligament pain Normal 3 hour GTT

## 2012-01-20 ENCOUNTER — Inpatient Hospital Stay (HOSPITAL_COMMUNITY)
Admission: AD | Admit: 2012-01-20 | Discharge: 2012-01-20 | Disposition: A | Discharge status: Home / Self Care | Payer: Medicaid Other | Source: Ambulatory Visit | Attending: Obstetrics and Gynecology | Admitting: Obstetrics and Gynecology

## 2012-01-20 ENCOUNTER — Encounter (HOSPITAL_COMMUNITY): Payer: Self-pay | Admitting: *Deleted

## 2012-01-20 DIAGNOSIS — Z331 Pregnant state, incidental: Secondary | ICD-10-CM

## 2012-01-20 DIAGNOSIS — R002 Palpitations: Secondary | ICD-10-CM

## 2012-01-20 DIAGNOSIS — E039 Hypothyroidism, unspecified: Secondary | ICD-10-CM | POA: Insufficient documentation

## 2012-01-20 DIAGNOSIS — R000 Tachycardia, unspecified: Secondary | ICD-10-CM | POA: Insufficient documentation

## 2012-01-20 DIAGNOSIS — IMO0001 Reserved for inherently not codable concepts without codable children: Secondary | ICD-10-CM

## 2012-01-20 DIAGNOSIS — O99891 Other specified diseases and conditions complicating pregnancy: Secondary | ICD-10-CM | POA: Insufficient documentation

## 2012-01-20 DIAGNOSIS — E079 Disorder of thyroid, unspecified: Secondary | ICD-10-CM | POA: Insufficient documentation

## 2012-01-20 LAB — CBC
HCT: 35.8 % — ABNORMAL LOW (ref 36.0–46.0)
Hemoglobin: 12.2 g/dL (ref 12.0–15.0)
WBC: 8.4 10*3/uL (ref 4.0–10.5)

## 2012-01-20 LAB — URINALYSIS, ROUTINE W REFLEX MICROSCOPIC
Glucose, UA: 250 mg/dL — AB
Hgb urine dipstick: NEGATIVE
Ketones, ur: 15 mg/dL — AB
Protein, ur: NEGATIVE mg/dL
Urobilinogen, UA: 0.2 mg/dL (ref 0.0–1.0)

## 2012-01-20 LAB — COMPREHENSIVE METABOLIC PANEL
ALT: 13 U/L (ref 0–35)
AST: 19 U/L (ref 0–37)
Albumin: 2.6 g/dL — ABNORMAL LOW (ref 3.5–5.2)
Calcium: 8.7 mg/dL (ref 8.4–10.5)
Creatinine, Ser: 0.43 mg/dL — ABNORMAL LOW (ref 0.50–1.10)
Total Protein: 6.2 g/dL (ref 6.0–8.3)

## 2012-01-20 LAB — RPR: RPR Ser Ql: NONREACTIVE

## 2012-01-20 NOTE — Progress Notes (Signed)
History  Carmen West is a 36 y.o. G3P2001 at [redacted]w[redacted]d     Patient states she feels like her heart rate is fast. States it has been 15 days since she felt this last. States she has a hx of rapid heart rate at times. States she is hypothyroid and takes medication. States last time she was checked, her numbers were off ("almost hyperthyroid") and MD wants to recheck soon. Did not adjust medication.    "weak when heart is racing, drank some coffee today and heart racing for 30 minutes, last time I felt it was 15 days ago, had thyroid labs 15 days ago adnt THS was 0.63 and Dr. Cassandria Santee wants [facility] to check it soon and every month, I am not to see him until after the baby is born, taking synthroid 150 mcg daily" denies srom, vag bleeding, uc, with +FM.  Chief Complaint  Patient presents with  . Tachycardia    @SFHPI @  Prior to Admission medications   Medication Sig Start Date End Date Taking? Authorizing Provider  levothyroxine (SYNTHROID, LEVOTHROID) 150 MCG tablet Take 150 mcg by mouth daily.   Yes Historical Provider, MD  Prenatal Vit-Fe Fumarate-FA (PRENATAL MULTIVITAMIN) TABS Take 1 tablet by mouth daily.   Yes Historical Provider, MD    Patient Active Problem List  Diagnosis  . Amenorrhea  . Family history of congenital heart defect  . AMA (advanced maternal age) multigravida 35+  . Previous pregnancy with congenital heart defect, currently pregnant  . UTI (lower urinary tract infection)  . Hypothyroidism  . History of cesarean delivery   Vitals:  Blood pressure 120/67, pulse 90, temperature 98.2 F (36.8 C), temperature source Oral, resp. rate 18, height 5' (1.524 m), weight 143 lb (64.864 kg), last menstrual period 06/20/2011, SpO2 99.00%. OB History    Grav Para Term Preterm Abortions TAB SAB Ect Mult Living   3 2 2       1       Past Medical History  Diagnosis Date  . Thyroid disease   . Hypothyroidism 2009    DR JAEJALI  . Depression     AFTER LOSS OF BABY;  NO MEDS  .  Shortness of breath     DURING SLEEP 3-4 X MONTH  . Infection   . Headache, migraine     IMPROVED  . UTI (urinary tract infection) following delivery   . Tachycardia     Past Surgical History  Procedure Date  . Cesarean section      Medication List     As of 01/20/2012  2:27 PM    ASK your doctor about these medications         levothyroxine 150 MCG tablet   Commonly known as: SYNTHROID, LEVOTHROID   Take 150 mcg by mouth daily.      prenatal multivitamin Tabs   Take 1 tablet by mouth daily.          Family History  Problem Relation Age of Onset  . Kidney disease Father     STONES  . Diabetes Maternal Uncle   . Congenital heart disease Daughter     died 64 days of age  . Birth defects Daughter     HEART DEFECT  . Hypertension Mother   . Arthritis Mother   . Heart disease Mother   . Hypothyroidism Mother   . Hypothyroidism Sister   . Birth defects Cousin     HYDOCEPHALIC    History  Substance Use Topics  .  Smoking status: Never Smoker   . Smokeless tobacco: Never Used  . Alcohol Use: No    Allergies: No Known Allergies  Prescriptions prior to admission  Medication Sig Dispense Refill  . levothyroxine (SYNTHROID, LEVOTHROID) 150 MCG tablet Take 150 mcg by mouth daily.      . Prenatal Vit-Fe Fumarate-FA (PRENATAL MULTIVITAMIN) TABS Take 1 tablet by mouth daily.        @ROS @ Physical Exam   Blood pressure 120/67, pulse 90, temperature 98.2 F (36.8 C), temperature source Oral, resp. rate 18, height 5' (1.524 m), weight 143 lb (64.864 kg), last menstrual period 06/20/2011, SpO2 99.00%.  @PHYSEXAMBYAGE2 @ Labs:  Recent Results (from the past 24 hour(s))  URINALYSIS, ROUTINE W REFLEX MICROSCOPIC   Collection Time   01/20/12 12:55 PM      Component Value Range   Color, Urine YELLOW  YELLOW   APPearance CLEAR  CLEAR   Specific Gravity, Urine 1.015  1.005 - 1.030   pH 7.0  5.0 - 8.0   Glucose, UA 250 (*) NEGATIVE mg/dL   Hgb urine dipstick  NEGATIVE  NEGATIVE   Bilirubin Urine NEGATIVE  NEGATIVE   Ketones, ur 15 (*) NEGATIVE mg/dL   Protein, ur NEGATIVE  NEGATIVE mg/dL   Urobilinogen, UA 0.2  0.0 - 1.0 mg/dL   Nitrite NEGATIVE  NEGATIVE   Leukocytes, UA NEGATIVE  NEGATIVE   ASSESSMENT: Patient Active Problem List  Diagnosis  . Amenorrhea  . Family history of congenital heart defect  . AMA (advanced maternal age) multigravida 35+  . Previous pregnancy with congenital heart defect, currently pregnant  . UTI (lower urinary tract infection)  . Hypothyroidism  . History of cesarean delivery   Physical Examination:  General appearance - alert, well appearing, and in no distress, anxious and cooperative Physical exam: Calm, no distress, HEENT wnl lungs clear bilaterally, AP RRR, abd soft, gravid, nt, bowel sounds active, abdomen nontender Normal hair distrubition mons pubis,  DTR + 1 bilaterally n no clonus No edema to lower extremities FHTS 140s LTV mod accels UC without ED Course  Assessment/Plan [redacted]w[redacted]d Complaint Weakness, tachycardia Thyroid labs, cbc, CMP, EKG Lavera Guise, CNM Addendum: reviewed CBC, CMP Labs, EKG WNL with Dr. Estanislado Pandy per telephone and Thyroid labs pending. Discharge home, discussed no caffeine, 8 water daily, change position slowly, 3 meals and 3 snacks daily, report if symptoms persist or change. Lavera Guise, CNM

## 2012-01-20 NOTE — MAU Note (Signed)
Patient states she feels like her heart rate is fast. States it has been 15 days since she felt this last. States she has a hx of rapid heart rate at times. States she is hypothyroid and takes medication. States last time she was checked, her numbers were off ("almost hyperthyroid") and MD wants to recheck soon. Did not adjust medication.

## 2012-01-22 ENCOUNTER — Ambulatory Visit (INDEPENDENT_AMBULATORY_CARE_PROVIDER_SITE_OTHER): Payer: Medicaid Other | Admitting: Obstetrics and Gynecology

## 2012-01-22 VITALS — BP 110/64 | Wt 144.0 lb

## 2012-01-22 DIAGNOSIS — Z331 Pregnant state, incidental: Secondary | ICD-10-CM

## 2012-01-22 NOTE — Progress Notes (Signed)
[redacted]w[redacted]d  Pt wants to discuss thyroid results. Pt unsure about Flu Vaccine will discuss w/ AVS,MD.

## 2012-01-22 NOTE — Progress Notes (Signed)
[redacted]w[redacted]d Thyroid studies normal.  Platelet count 130,000.  Chemistries normal. Tachycardias better. Return to office in 2 weeks. Repeat platelet count in 4 weeks. Dr. Stefano Gaul

## 2012-01-23 ENCOUNTER — Emergency Department (HOSPITAL_COMMUNITY)
Admission: EM | Admit: 2012-01-23 | Discharge: 2012-01-23 | Disposition: A | Discharge status: Home Health | Payer: Medicaid Other | Attending: Emergency Medicine | Admitting: Emergency Medicine

## 2012-01-23 ENCOUNTER — Encounter (HOSPITAL_COMMUNITY): Payer: Self-pay | Admitting: *Deleted

## 2012-01-23 DIAGNOSIS — J069 Acute upper respiratory infection, unspecified: Secondary | ICD-10-CM

## 2012-01-23 DIAGNOSIS — E039 Hypothyroidism, unspecified: Secondary | ICD-10-CM | POA: Insufficient documentation

## 2012-01-23 HISTORY — DX: Encounter for supervision of normal pregnancy, unspecified, unspecified trimester: Z34.90

## 2012-01-23 NOTE — ED Provider Notes (Signed)
History  This chart was scribed for Flint Melter, MD by Ardeen Jourdain. This patient was seen in room TR07C/TR07C and the patient's care was started at 1822.  CSN: [id number]  Arrival date & time 01/23/12  1701   None     Chief Complaint  Patient presents with  . Sore Throat     The history is provided by the patient. No language interpreter was used.    Carmen West is a 36 y.o. female who presents to the Emergency Department complaining of a gradually worsening sore throat that she woke up with this morning. She is [redacted] weeks pregnant and wanted to be checked out to make sure there are not any further complications. She denies fever, pain in nose or ears, and cough but she had a slight HA yesterday. She admits to possible sick contact. She states that she has had a normal pregnancy with no complications. She has a h/o HTN, thyroid disease and depression. She denies smoking and alcohol use.   Past Medical History  Diagnosis Date  . Thyroid disease   . Hypothyroidism 2009    DR JAEJALI  . Depression     AFTER LOSS OF BABY;  NO MEDS  . Shortness of breath     DURING SLEEP 3-4 X MONTH  . Infection   . Headache, migraine     IMPROVED  . UTI (urinary tract infection) following delivery   . Tachycardia   . Pregnant     Past Surgical History  Procedure Date  . Cesarean section     Family History  Problem Relation Age of Onset  . Kidney disease Father     STONES  . Diabetes Maternal Uncle   . Congenital heart disease Daughter     died 6 days of age  . Birth defects Daughter     HEART DEFECT  . Hypertension Mother   . Arthritis Mother   . Heart disease Mother   . Hypothyroidism Mother   . Hypothyroidism Sister   . Birth defects Cousin     HYDOCEPHALIC    History  Substance Use Topics  . Smoking status: Never Smoker   . Smokeless tobacco: Never Used  . Alcohol Use: No    OB History    Grav Para Term Preterm Abortions TAB SAB Ect Mult Living   3 2 2        1       Review of Systems  HENT: Positive for sore throat.   All other systems reviewed and are negative.    Allergies  Review of patient's allergies indicates no known allergies.  Home Medications   Current Outpatient Rx  Name Route Sig Dispense Refill  . LEVOTHYROXINE SODIUM 150 MCG PO TABS Oral Take 150 mcg by mouth daily.    Marland Kitchen PRENATAL MULTIVITAMIN CH Oral Take 1 tablet by mouth daily.      Triage Vitals: BP 126/64  Pulse 100  Temp 98.1 F (36.7 C) (Oral)  SpO2 100%  LMP 06/20/2011  Physical Exam  Nursing note and vitals reviewed. Constitutional: She is oriented to person, place, and time. She appears well-developed and well-nourished. No distress.  HENT:  Head: Normocephalic and atraumatic.  Right Ear: External ear normal.  Left Ear: External ear normal.       SLight erythema in throat, no edema  Eyes: Conjunctivae normal and EOM are normal. Pupils are equal, round, and reactive to light.  Neck: Normal range of motion. Neck supple. No tracheal  deviation present.  Cardiovascular: Normal rate, regular rhythm and normal heart sounds.   Pulmonary/Chest: Effort normal and breath sounds normal. No respiratory distress.  Abdominal: Soft. She exhibits no distension.  Musculoskeletal: Normal range of motion. She exhibits no edema.  Neurological: She is alert and oriented to person, place, and time.  Skin: Skin is warm and dry.  Psychiatric: She has a normal mood and affect. Her behavior is normal.    ED Course  Procedures (including critical care time)  DIAGNOSTIC STUDIES: Oxygen Saturation is 100% on room air, normal by my interpretation.    COORDINATION OF CARE:  1830- Discussed treatment plan with pt at bedside and pt agreed to plan.  1831- Plan: Home Medications-  Tylenol, throat lozenge; Home Treatments- gargle with salt water, rest and drink plenty of fluids; Recommended follow up- se PCP or come back in if needed     Labs Reviewed  RAPID STREP  SCREEN   No results found.   1. URI (upper respiratory infection)       MDM  URI without signs of PNE, Sinusitis or pregancy complications. Doubt metabolic instability, serious bacterial infection or impending vascular collapse; the patient is stable for discharge.    I personally performed the services described in this documentation, which was scribed in my presence. The recorded information has been reviewed and considered.    Flint Melter, MD 01/23/12 2206

## 2012-01-23 NOTE — ED Notes (Signed)
Patient woke up this am with sore throat.  Patient is pregnant and wanted to come to be seen.  Patient is 30weeks and three days.  NO other complaints

## 2012-01-29 ENCOUNTER — Encounter (HOSPITAL_COMMUNITY): Payer: Self-pay

## 2012-01-29 ENCOUNTER — Inpatient Hospital Stay (HOSPITAL_COMMUNITY): Payer: Medicaid Other

## 2012-01-29 ENCOUNTER — Inpatient Hospital Stay (HOSPITAL_COMMUNITY)
Admission: AD | Admit: 2012-01-29 | Discharge: 2012-01-29 | Disposition: A | Discharge status: Home / Self Care | Payer: Medicaid Other | Source: Ambulatory Visit | Attending: Obstetrics and Gynecology | Admitting: Obstetrics and Gynecology

## 2012-01-29 DIAGNOSIS — R000 Tachycardia, unspecified: Secondary | ICD-10-CM | POA: Diagnosis present

## 2012-01-29 DIAGNOSIS — F419 Anxiety disorder, unspecified: Secondary | ICD-10-CM | POA: Diagnosis present

## 2012-01-29 DIAGNOSIS — N390 Urinary tract infection, site not specified: Secondary | ICD-10-CM

## 2012-01-29 DIAGNOSIS — R296 Repeated falls: Secondary | ICD-10-CM | POA: Insufficient documentation

## 2012-01-29 DIAGNOSIS — M79609 Pain in unspecified limb: Secondary | ICD-10-CM | POA: Insufficient documentation

## 2012-01-29 DIAGNOSIS — O09529 Supervision of elderly multigravida, unspecified trimester: Secondary | ICD-10-CM

## 2012-01-29 DIAGNOSIS — Y92009 Unspecified place in unspecified non-institutional (private) residence as the place of occurrence of the external cause: Secondary | ICD-10-CM | POA: Insufficient documentation

## 2012-01-29 DIAGNOSIS — D696 Thrombocytopenia, unspecified: Secondary | ICD-10-CM | POA: Diagnosis present

## 2012-01-29 DIAGNOSIS — E039 Hypothyroidism, unspecified: Secondary | ICD-10-CM

## 2012-01-29 DIAGNOSIS — O09299 Supervision of pregnancy with other poor reproductive or obstetric history, unspecified trimester: Secondary | ICD-10-CM

## 2012-01-29 DIAGNOSIS — IMO0002 Reserved for concepts with insufficient information to code with codable children: Secondary | ICD-10-CM | POA: Diagnosis not present

## 2012-01-29 DIAGNOSIS — O99891 Other specified diseases and conditions complicating pregnancy: Secondary | ICD-10-CM | POA: Insufficient documentation

## 2012-01-29 DIAGNOSIS — Z98891 History of uterine scar from previous surgery: Secondary | ICD-10-CM

## 2012-01-29 LAB — CBC
HCT: 37.3 % (ref 36.0–46.0)
Hemoglobin: 12.5 g/dL (ref 12.0–15.0)
MCH: 31.7 pg (ref 26.0–34.0)
MCHC: 33.5 g/dL (ref 30.0–36.0)
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
## 2012-01-29 NOTE — MAU Note (Signed)
Patient states about two hours ago she fell and hurt her left hand and arm. States she did not hit anything else. Denies any contractions, bleeding or leaking and reports good fetal movement.
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
## 2012-01-30 ENCOUNTER — Other Ambulatory Visit: Payer: Self-pay | Admitting: Obstetrics and Gynecology

## 2012-01-30 ENCOUNTER — Telehealth: Payer: Self-pay | Admitting: Obstetrics and Gynecology

## 2012-01-30 DIAGNOSIS — R000 Tachycardia, unspecified: Secondary | ICD-10-CM

## 2012-01-30 NOTE — Telephone Encounter (Signed)
TC to pt. LM to return call.  

## 2012-01-30 NOTE — Telephone Encounter (Signed)
TC to pt. Informed of appt at [facility] 02/10/12 at 11:15. Pt given # for [facility] and [employer].  To call to schedule appt and inform office of date. Pt verbalizes comprehension.

## 2012-01-30 NOTE — Telephone Encounter (Signed)
Message copied by Mason Jim on Thu Jan 30, 2012  9:53 AM ------      Message from: Malissa Hippo.      Created: Wed Jan 29, 2012  4:43 PM      Regarding: Cardiology referral        Pt is 31wks G3P2, living 1,       Has been seen a couple times for tachycardia during pregnancy      C/o of occasionally feeling heart racing      She is also very anxious            Thanks       SL

## 2012-01-31 ENCOUNTER — Ambulatory Visit (INDEPENDENT_AMBULATORY_CARE_PROVIDER_SITE_OTHER): Payer: Medicaid Other | Admitting: Obstetrics and Gynecology

## 2012-01-31 VITALS — BP 124/60 | Wt 145.0 lb

## 2012-01-31 DIAGNOSIS — E039 Hypothyroidism, unspecified: Secondary | ICD-10-CM

## 2012-01-31 DIAGNOSIS — Z98891 History of uterine scar from previous surgery: Secondary | ICD-10-CM

## 2012-01-31 DIAGNOSIS — F419 Anxiety disorder, unspecified: Secondary | ICD-10-CM

## 2012-01-31 DIAGNOSIS — T7411XA Adult physical abuse, confirmed, initial encounter: Secondary | ICD-10-CM

## 2012-01-31 DIAGNOSIS — F411 Generalized anxiety disorder: Secondary | ICD-10-CM

## 2012-01-31 DIAGNOSIS — IMO0002 Reserved for concepts with insufficient information to code with codable children: Secondary | ICD-10-CM

## 2012-01-31 DIAGNOSIS — O09299 Supervision of pregnancy with other poor reproductive or obstetric history, unspecified trimester: Secondary | ICD-10-CM

## 2012-01-31 DIAGNOSIS — D696 Thrombocytopenia, unspecified: Secondary | ICD-10-CM

## 2012-01-31 DIAGNOSIS — O09529 Supervision of elderly multigravida, unspecified trimester: Secondary | ICD-10-CM

## 2012-01-31 DIAGNOSIS — Z9889 Other specified postprocedural states: Secondary | ICD-10-CM

## 2012-01-31 NOTE — Progress Notes (Signed)
[redacted]w[redacted]d BP good now DTR 2+, lungs CTA, CV RRR Cardiology appt set up already for the 28th secondary to palpitation FKCs Keep appt for next wk Gest Htn precautions

## 2012-01-31 NOTE — Progress Notes (Signed)
Pt presented to office.  States had medical appt and was advised to have eval today due to increased BP.  Sched with Dr Alinda Sierras.
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
## 2012-02-05 ENCOUNTER — Encounter: Payer: Medicaid Other | Admitting: Obstetrics and Gynecology

## 2012-02-05 ENCOUNTER — Inpatient Hospital Stay (HOSPITAL_COMMUNITY)
Admission: AD | Admit: 2012-02-05 | Discharge: 2012-02-05 | Disposition: A | Discharge status: Home / Self Care | Payer: Medicaid Other | Source: Ambulatory Visit | Attending: Obstetrics and Gynecology | Admitting: Obstetrics and Gynecology

## 2012-02-05 ENCOUNTER — Encounter (HOSPITAL_COMMUNITY): Payer: Self-pay | Admitting: *Deleted

## 2012-02-05 ENCOUNTER — Ambulatory Visit (INDEPENDENT_AMBULATORY_CARE_PROVIDER_SITE_OTHER): Payer: Medicaid Other | Admitting: Obstetrics and Gynecology

## 2012-02-05 VITALS — BP 120/62 | Wt 146.0 lb

## 2012-02-05 DIAGNOSIS — J111 Influenza due to unidentified influenza virus with other respiratory manifestations: Secondary | ICD-10-CM | POA: Insufficient documentation

## 2012-02-05 DIAGNOSIS — Z331 Pregnant state, incidental: Secondary | ICD-10-CM

## 2012-02-05 DIAGNOSIS — T50B95A Adverse effect of other viral vaccines, initial encounter: Secondary | ICD-10-CM | POA: Insufficient documentation

## 2012-02-05 DIAGNOSIS — Z23 Encounter for immunization: Secondary | ICD-10-CM

## 2012-02-05 DIAGNOSIS — Z888 Allergy status to other drugs, medicaments and biological substances status: Secondary | ICD-10-CM

## 2012-02-05 DIAGNOSIS — O99891 Other specified diseases and conditions complicating pregnancy: Secondary | ICD-10-CM | POA: Insufficient documentation

## 2012-02-05 DIAGNOSIS — T50A95A Adverse effect of other bacterial vaccines, initial encounter: Secondary | ICD-10-CM | POA: Insufficient documentation
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
## 2012-02-05 NOTE — Progress Notes (Signed)
[redacted]w[redacted]d was seen at MAU for a fall with normal ultrasound and AGA No complaints Appointment with cardiologist for tachycardia 02/10/12 with TSH at 0.4 and normal Free T4

## 2012-02-05 NOTE — Progress Notes (Signed)
Pt stated no issues today. Flu shot given today.  

## 2012-02-05 NOTE — MAU Note (Signed)
Pt states she had the flu shot today around noon and since then she feels likse her ears is hot and red and also on the right side of her face-at present it is almost gone

## 2012-02-05 NOTE — Progress Notes (Signed)
Written and verbal d/c instructions given and understanding voiced. 

## 2012-02-05 NOTE — Progress Notes (Signed)
Mary CNM notified of pt's reactive FHR and occ ctx with u/i. Stable for d/c home. F/u in office as scheduled.

## 2012-02-10 ENCOUNTER — Encounter: Payer: Self-pay | Admitting: Cardiology

## 2012-02-10 ENCOUNTER — Ambulatory Visit (INDEPENDENT_AMBULATORY_CARE_PROVIDER_SITE_OTHER): Payer: Medicaid Other | Admitting: Cardiology

## 2012-02-10 VITALS — BP 139/81 | HR 118 | Wt 146.0 lb

## 2012-02-10 DIAGNOSIS — R002 Palpitations: Secondary | ICD-10-CM | POA: Insufficient documentation

## 2012-02-10 DIAGNOSIS — E039 Hypothyroidism, unspecified: Secondary | ICD-10-CM

## 2012-02-10 NOTE — Patient Instructions (Addendum)
Your physician recommends that you schedule a follow-up appointment in: 6 WEEKS WITH DR CRENSHAW  Your physician has requested that you have an echocardiogram. Echocardiography is a painless test that uses sound waves to create images of your heart. It provides your doctor with information about the size and shape of your heart and how well your heart's chambers and valves are working. This procedure takes approximately one hour. There are no restrictions for this procedure.   Your physician has recommended that you wear an event monitor. Event monitors are medical devices that record the heart's electrical activity. Doctors most often us these monitors to diagnose arrhythmias. Arrhythmias are problems with the speed or rhythm of the heartbeat. The monitor is a small, portable device. You can wear one while you do your normal daily activities. This is usually used to diagnose what is causing palpitations/syncope (passing out).    

## 2012-02-10 NOTE — Assessment & Plan Note (Signed)
Continue present dose of Synthroid.

## 2012-02-10 NOTE — Assessment & Plan Note (Signed)
Etiology unclear. Thyroid functions are normal. Check echocardiogram to assess LV function and CardioNet to exclude arrhythmia.
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
## 2012-02-14 ENCOUNTER — Encounter: Payer: Self-pay | Admitting: Obstetrics and Gynecology

## 2012-02-14 ENCOUNTER — Telehealth: Payer: Self-pay | Admitting: Obstetrics and Gynecology

## 2012-02-14 NOTE — Progress Notes (Signed)
Pt walked into office, is 33 wks 2 days States she has been having contractions that started last pm, has gotten better today.  Pt admits to not drinking water nor eating a well balanced diet everyday.  Pt advised of the importance of getting in 8-10 glass of water/day, eating small frequent meals.  Pt advised to go home, eat and drink, monitor fetal movement and call back with any concerns.  Pt requests FHT today, was 150 bpm.

## 2012-02-19 ENCOUNTER — Encounter: Payer: Self-pay | Admitting: Obstetrics and Gynecology

## 2012-02-19 ENCOUNTER — Ambulatory Visit (INDEPENDENT_AMBULATORY_CARE_PROVIDER_SITE_OTHER): Payer: Medicaid Other | Admitting: Obstetrics and Gynecology

## 2012-02-19 VITALS — BP 118/60 | Wt 148.0 lb

## 2012-02-19 DIAGNOSIS — Z331 Pregnant state, incidental: Secondary | ICD-10-CM

## 2012-02-19 NOTE — Progress Notes (Signed)
[redacted]w[redacted]d GFM Saw cardiologist and scheduled for Holter and echocardiogram

## 2012-02-19 NOTE — Progress Notes (Signed)
[redacted]w[redacted]d  Pt has no complaints today

## 2012-02-20 ENCOUNTER — Encounter (INDEPENDENT_AMBULATORY_CARE_PROVIDER_SITE_OTHER): Payer: Medicaid Other

## 2012-02-20 DIAGNOSIS — R002 Palpitations: Secondary | ICD-10-CM

## 2012-02-21 ENCOUNTER — Ambulatory Visit (HOSPITAL_COMMUNITY): Payer: Medicaid Other | Attending: Cardiology

## 2012-02-21 DIAGNOSIS — I379 Nonrheumatic pulmonary valve disorder, unspecified: Secondary | ICD-10-CM | POA: Insufficient documentation

## 2012-02-21 DIAGNOSIS — I369 Nonrheumatic tricuspid valve disorder, unspecified: Secondary | ICD-10-CM | POA: Insufficient documentation

## 2012-02-21 DIAGNOSIS — R002 Palpitations: Secondary | ICD-10-CM

## 2012-02-21 NOTE — Progress Notes (Signed)
Echocardiogram performed.  

## 2012-02-24 ENCOUNTER — Telehealth: Payer: Self-pay

## 2012-02-24 ENCOUNTER — Ambulatory Visit (INDEPENDENT_AMBULATORY_CARE_PROVIDER_SITE_OTHER): Payer: Medicaid Other | Admitting: Obstetrics and Gynecology

## 2012-02-24 ENCOUNTER — Encounter: Payer: Self-pay | Admitting: Obstetrics and Gynecology

## 2012-02-24 VITALS — BP 140/70 | Wt 151.0 lb

## 2012-02-24 DIAGNOSIS — O1420 HELLP syndrome (HELLP), unspecified trimester: Secondary | ICD-10-CM

## 2012-02-24 DIAGNOSIS — D696 Thrombocytopenia, unspecified: Secondary | ICD-10-CM

## 2012-02-24 DIAGNOSIS — R42 Dizziness and giddiness: Secondary | ICD-10-CM

## 2012-02-24 DIAGNOSIS — O1410 Severe pre-eclampsia, unspecified trimester: Secondary | ICD-10-CM

## 2012-02-24 LAB — CBC WITH DIFFERENTIAL/PLATELET
Eosinophils Absolute: 0 10*3/uL (ref 0.0–0.7)
Eosinophils Relative: 0 % (ref 0–5)
HCT: 37.1 % (ref 36.0–46.0)
Lymphs Abs: 1.6 10*3/uL (ref 0.7–4.0)
MCH: 31.5 pg (ref 26.0–34.0)
MCV: 92.1 fL (ref 78.0–100.0)
Monocytes Absolute: 0.5 10*3/uL (ref 0.1–1.0)
Platelets: 133 10*3/uL — ABNORMAL LOW (ref 150–400)
RBC: 4.03 MIL/uL (ref 3.87–5.11)
RDW: 13.7 % (ref 11.5–15.5)

## 2012-02-24 NOTE — Telephone Encounter (Signed)
Tc from pt. Pt c/o consistant ringing in right ear since yesterday. Pt c/o dizziness. No headaches or visual changes. Pt req to have BP checked today. Appt sched today @ 4:15 with SL. Pt agrees.

## 2012-02-24 NOTE — Progress Notes (Signed)
[redacted]w[redacted]d R arm after 5 mins 138/70; rechecked 3rd time at the pt's request 120/68 Work in pt c/o dizziness and ringing in R ear x 2 days

## 2012-02-24 NOTE — Progress Notes (Signed)
[redacted]w[redacted]d Pt very anxious, multiple worries, states she feels "swishy" noise in R ear, pt requesting thyroid labs, they were just drawn 1 month ago and were normal  Currently wearing halter monitor Heart: RRR Lungs: CTAB Ears: clear  Will draw CBC today to check platelets Enc pt increase fluids Has appt next week  Call if sx's worsen [facility]

## 2012-02-28 ENCOUNTER — Encounter (HOSPITAL_COMMUNITY): Payer: Self-pay | Admitting: Obstetrics and Gynecology

## 2012-02-28 ENCOUNTER — Inpatient Hospital Stay (HOSPITAL_COMMUNITY)
Admission: AD | Admit: 2012-02-28 | Discharge: 2012-02-28 | Disposition: A | Discharge status: Home / Self Care | Payer: Medicaid Other | Source: Ambulatory Visit | Attending: Obstetrics and Gynecology | Admitting: Obstetrics and Gynecology

## 2012-02-28 DIAGNOSIS — E039 Hypothyroidism, unspecified: Secondary | ICD-10-CM | POA: Insufficient documentation

## 2012-02-28 DIAGNOSIS — D696 Thrombocytopenia, unspecified: Secondary | ICD-10-CM

## 2012-02-28 DIAGNOSIS — Z98891 History of uterine scar from previous surgery: Secondary | ICD-10-CM

## 2012-02-28 DIAGNOSIS — R42 Dizziness and giddiness: Secondary | ICD-10-CM | POA: Insufficient documentation

## 2012-02-28 DIAGNOSIS — Z331 Pregnant state, incidental: Secondary | ICD-10-CM

## 2012-02-28 DIAGNOSIS — O09299 Supervision of pregnancy with other poor reproductive or obstetric history, unspecified trimester: Secondary | ICD-10-CM

## 2012-02-28 DIAGNOSIS — F419 Anxiety disorder, unspecified: Secondary | ICD-10-CM

## 2012-02-28 DIAGNOSIS — O99891 Other specified diseases and conditions complicating pregnancy: Secondary | ICD-10-CM | POA: Insufficient documentation

## 2012-02-28 DIAGNOSIS — R0602 Shortness of breath: Secondary | ICD-10-CM

## 2012-02-28 DIAGNOSIS — E079 Disorder of thyroid, unspecified: Secondary | ICD-10-CM | POA: Insufficient documentation

## 2012-02-28 DIAGNOSIS — O09529 Supervision of elderly multigravida, unspecified trimester: Secondary | ICD-10-CM

## 2012-02-28 DIAGNOSIS — R51 Headache: Secondary | ICD-10-CM | POA: Insufficient documentation

## 2012-02-28 DIAGNOSIS — IMO0002 Reserved for concepts with insufficient information to code with codable children: Secondary | ICD-10-CM

## 2012-02-28 LAB — COMPREHENSIVE METABOLIC PANEL
Albumin: 2.7 g/dL — ABNORMAL LOW (ref 3.5–5.2)
Alkaline Phosphatase: 69 U/L (ref 39–117)
BUN: 6 mg/dL (ref 6–23)
CO2: 22 mEq/L (ref 19–32)
Chloride: 99 mEq/L (ref 96–112)
Creatinine, Ser: 0.41 mg/dL — ABNORMAL LOW (ref 0.50–1.10)
GFR calc Af Amer: >90 mL/min (ref >90)
GFR calc non Af Amer: >90 mL/min (ref >90)
Glucose, Bld: 95 mg/dL (ref 70–99)
Potassium: 3.6 mEq/L (ref 3.5–5.1)
Total Bilirubin: 0.3 mg/dL (ref 0.3–1.2)

## 2012-02-28 LAB — URINALYSIS, ROUTINE W REFLEX MICROSCOPIC
Bilirubin Urine: NEGATIVE
Hgb urine dipstick: NEGATIVE
Ketones, ur: NEGATIVE mg/dL
Nitrite: NEGATIVE
pH: 7 (ref 5.0–8.0)
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
## 2012-02-28 NOTE — MAU Note (Signed)
Patient presents with c/o SOB, headache, dizziness since last night, 35w2

## 2012-03-01 ENCOUNTER — Telehealth: Payer: Self-pay | Admitting: Obstetrics and Gynecology
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
## 2012-03-05 ENCOUNTER — Ambulatory Visit (INDEPENDENT_AMBULATORY_CARE_PROVIDER_SITE_OTHER): Payer: Medicaid Other

## 2012-03-05 ENCOUNTER — Encounter: Payer: Self-pay | Admitting: Obstetrics and Gynecology

## 2012-03-05 ENCOUNTER — Ambulatory Visit (INDEPENDENT_AMBULATORY_CARE_PROVIDER_SITE_OTHER): Payer: Medicaid Other | Admitting: Obstetrics and Gynecology

## 2012-03-05 VITALS — BP 126/60 | Wt 152.0 lb

## 2012-03-05 DIAGNOSIS — O9989 Other specified diseases and conditions complicating pregnancy, childbirth and the puerperium: Secondary | ICD-10-CM

## 2012-03-05 DIAGNOSIS — Z331 Pregnant state, incidental: Secondary | ICD-10-CM

## 2012-03-05 NOTE — Progress Notes (Signed)
[redacted]w[redacted]d  Pt has moderate cramping x3 days  Pt went to [facility] last week with complaints of headache and rapid heartbeat

## 2012-03-05 NOTE — Addendum Note (Signed)
Addended by: Darien Ramus on: 03/05/2012 02:38 PM   Modules accepted: Orders

## 2012-03-05 NOTE — Progress Notes (Signed)
[redacted]w[redacted]d GBS today Ultrasound:  6 lbs 13 oz  81 %                     AFI normal

## 2012-03-05 NOTE — Progress Notes (Signed)
[redacted]w[redacted]d Sono: EFW  6lb +/- 7oz 81st %tile AFI 15.3 cm Vertex presentation. Posterior placenta Normal fluid. AFI of 15.3cm = 55th %tile Normal linear growth: EFW = 81st %tile No late presenting fetal abnormality is see. CX not measured.

## 2012-03-09 LAB — US OB FOLLOW UP

## 2012-03-11 ENCOUNTER — Ambulatory Visit (INDEPENDENT_AMBULATORY_CARE_PROVIDER_SITE_OTHER): Payer: Medicaid Other | Admitting: Obstetrics and Gynecology

## 2012-03-11 VITALS — BP 130/64 | Wt 153.0 lb

## 2012-03-11 DIAGNOSIS — Z331 Pregnant state, incidental: Secondary | ICD-10-CM

## 2012-03-11 NOTE — Progress Notes (Signed)
[redacted]w[redacted]d Beta strep positive.  Treatment discussed. Return to office in 1 week. Dr. Stefano Gaul

## 2012-03-11 NOTE — Progress Notes (Signed)
[redacted]w[redacted]d No complaints today.

## 2012-03-16 ENCOUNTER — Telehealth: Payer: Self-pay | Admitting: *Deleted

## 2012-03-16 NOTE — Telephone Encounter (Signed)
Spoke with pt, aware monitor reviewed by dr Jens Som shows sinus. She cx her follow up appt because she is having a c-section 03-30-12. She will call back to reschedule.

## 2012-03-17 ENCOUNTER — Encounter: Payer: Self-pay | Admitting: Obstetrics and Gynecology

## 2012-03-17 ENCOUNTER — Ambulatory Visit (INDEPENDENT_AMBULATORY_CARE_PROVIDER_SITE_OTHER): Payer: Medicaid Other | Admitting: Obstetrics and Gynecology

## 2012-03-17 VITALS — BP 118/74 | Wt 152.0 lb

## 2012-03-17 DIAGNOSIS — O09529 Supervision of elderly multigravida, unspecified trimester: Secondary | ICD-10-CM

## 2012-03-17 DIAGNOSIS — F419 Anxiety disorder, unspecified: Secondary | ICD-10-CM

## 2012-03-17 DIAGNOSIS — F411 Generalized anxiety disorder: Secondary | ICD-10-CM

## 2012-03-17 DIAGNOSIS — O368190 Decreased fetal movements, unspecified trimester, not applicable or unspecified: Secondary | ICD-10-CM

## 2012-03-17 NOTE — Progress Notes (Addendum)
Pt stated she has not felt baby move as much. NST reactive Kick count instructions given

## 2012-03-17 NOTE — Patient Instructions (Signed)
Fetal Movement Counts Patient Name: __________________________________________________ Patient Due Date: ____________________ Kick counts is highly recommended in high risk pregnancies, but it is a good idea for every pregnant woman to do. Start counting fetal movements at 28 weeks of the pregnancy. Fetal movements increase after eating a full meal or eating or drinking something sweet (the blood sugar is higher). It is also important to drink plenty of fluids (well hydrated) before doing the count. Lie on your left side because it helps with the circulation or you can sit in a comfortable chair with your arms over your belly (abdomen) with no distractions around you. DOING THE COUNT  Try to do the count the same time of day each time you do it.  Mark the day and time, then see how long it takes for you to feel 10 movements (kicks, flutters, swishes, rolls). You should have at least 10 movements within 2 hours. You will most likely feel 10 movements in much less than 2 hours. If you do not, wait an hour and count again. After a couple of days you will see a pattern.  What you are looking for is a change in the pattern or not enough counts in 2 hours. Is it taking longer in time to reach 10 movements? SEEK MEDICAL CARE IF:  You feel less than 10 counts in 2 hours. Tried twice.  No movement in one hour.  The pattern is changing or taking longer each day to reach 10 counts in 2 hours.  You feel the baby is not moving as it usually does. Date: ____________ Movements: ____________ Start time: ____________ Finish time: ____________  Date: ____________ Movements: ____________ Start time: ____________ Finish time: ____________ Date: ____________ Movements: ____________ Start time: ____________ Finish time: ____________ Date: ____________ Movements: ____________ Start time: ____________ Finish time: ____________ Date: ____________ Movements: ____________ Start time: ____________ Finish time:  ____________ Date: ____________ Movements: ____________ Start time: ____________ Finish time: ____________ Date: ____________ Movements: ____________ Start time: ____________ Finish time: ____________ Date: ____________ Movements: ____________ Start time: ____________ Finish time: ____________  Date: ____________ Movements: ____________ Start time: ____________ Finish time: ____________ Date: ____________ Movements: ____________ Start time: ____________ Finish time: ____________ Date: ____________ Movements: ____________ Start time: ____________ Finish time: ____________ Date: ____________ Movements: ____________ Start time: ____________ Finish time: ____________ Date: ____________ Movements: ____________ Start time: ____________ Finish time: ____________ Date: ____________ Movements: ____________ Start time: ____________ Finish time: ____________ Date: ____________ Movements: ____________ Start time: ____________ Finish time: ____________  Date: ____________ Movements: ____________ Start time: ____________ Finish time: ____________ Date: ____________ Movements: ____________ Start time: ____________ Finish time: ____________ Date: ____________ Movements: ____________ Start time: ____________ Finish time: ____________ Date: ____________ Movements: ____________ Start time: ____________ Finish time: ____________ Date: ____________ Movements: ____________ Start time: ____________ Finish time: ____________ Date: ____________ Movements: ____________ Start time: ____________ Finish time: ____________ Date: ____________ Movements: ____________ Start time: ____________ Finish time: ____________  Date: ____________ Movements: ____________ Start time: ____________ Finish time: ____________ Date: ____________ Movements: ____________ Start time: ____________ Finish time: ____________ Date: ____________ Movements: ____________ Start time: ____________ Finish time: ____________ Date: ____________ Movements:  ____________ Start time: ____________ Finish time: ____________ Date: ____________ Movements: ____________ Start time: ____________ Finish time: ____________ Date: ____________ Movements: ____________ Start time: ____________ Finish time: ____________ Date: ____________ Movements: ____________ Start time: ____________ Finish time: ____________  Date: ____________ Movements: ____________ Start time: ____________ Finish time: ____________ Date: ____________ Movements: ____________ Start time: ____________ Finish time: ____________ Date: ____________ Movements: ____________ Start time: ____________ Finish time: ____________ Date: ____________ Movements:   ____________ Start time: ____________ Finish time: ____________ Date: ____________ Movements: ____________ Start time: ____________ Finish time: ____________ Date: ____________ Movements: ____________ Start time: ____________ Finish time: ____________ Date: ____________ Movements: ____________ Start time: ____________ Finish time: ____________  Date: ____________ Movements: ____________ Start time: ____________ Finish time: ____________ Date: ____________ Movements: ____________ Start time: ____________ Finish time: ____________ Date: ____________ Movements: ____________ Start time: ____________ Finish time: ____________ Date: ____________ Movements: ____________ Start time: ____________ Finish time: ____________ Date: ____________ Movements: ____________ Start time: ____________ Finish time: ____________ Date: ____________ Movements: ____________ Start time: ____________ Finish time: ____________ Date: ____________ Movements: ____________ Start time: ____________ Finish time: ____________  Date: ____________ Movements: ____________ Start time: ____________ Finish time: ____________ Date: ____________ Movements: ____________ Start time: ____________ Finish time: ____________ Date: ____________ Movements: ____________ Start time: ____________ Finish  time: ____________ Date: ____________ Movements: ____________ Start time: ____________ Finish time: ____________ Date: ____________ Movements: ____________ Start time: ____________ Finish time: ____________ Date: ____________ Movements: ____________ Start time: ____________ Finish time: ____________ Date: ____________ Movements: ____________ Start time: ____________ Finish time: ____________  Date: ____________ Movements: ____________ Start time: ____________ Finish time: ____________ Date: ____________ Movements: ____________ Start time: ____________ Finish time: ____________ Date: ____________ Movements: ____________ Start time: ____________ Finish time: ____________ Date: ____________ Movements: ____________ Start time: ____________ Finish time: ____________ Date: ____________ Movements: ____________ Start time: ____________ Finish time: ____________ Date: ____________ Movements: ____________ Start time: ____________ Finish time: ____________ Document Released: 05/01/2006 Document Revised: 06/24/2011 Document Reviewed: 11/01/2008 [employer] Patient Information 2013 [employer].  

## 2012-03-18 ENCOUNTER — Inpatient Hospital Stay (HOSPITAL_COMMUNITY)
Admission: AD | Admit: 2012-03-18 | Discharge: 2012-03-18 | Disposition: A | Discharge status: Home / Self Care | Payer: Medicaid Other | Source: Ambulatory Visit | Attending: Obstetrics and Gynecology | Admitting: Obstetrics and Gynecology

## 2012-03-18 ENCOUNTER — Encounter (HOSPITAL_COMMUNITY): Payer: Self-pay | Admitting: *Deleted

## 2012-03-18 DIAGNOSIS — D696 Thrombocytopenia, unspecified: Secondary | ICD-10-CM

## 2012-03-18 DIAGNOSIS — O479 False labor, unspecified: Secondary | ICD-10-CM | POA: Insufficient documentation

## 2012-03-18 DIAGNOSIS — M899 Disorder of bone, unspecified: Secondary | ICD-10-CM

## 2012-03-18 DIAGNOSIS — E039 Hypothyroidism, unspecified: Secondary | ICD-10-CM

## 2012-03-18 DIAGNOSIS — F419 Anxiety disorder, unspecified: Secondary | ICD-10-CM

## 2012-03-18 DIAGNOSIS — M259 Joint disorder, unspecified: Secondary | ICD-10-CM

## 2012-03-18 DIAGNOSIS — O09529 Supervision of elderly multigravida, unspecified trimester: Secondary | ICD-10-CM

## 2012-03-18 DIAGNOSIS — O09299 Supervision of pregnancy with other poor reproductive or obstetric history, unspecified trimester: Secondary | ICD-10-CM

## 2012-03-18 DIAGNOSIS — O218 Other vomiting complicating pregnancy: Secondary | ICD-10-CM

## 2012-03-18 DIAGNOSIS — O9989 Other specified diseases and conditions complicating pregnancy, childbirth and the puerperium: Secondary | ICD-10-CM

## 2012-03-18 DIAGNOSIS — R197 Diarrhea, unspecified: Secondary | ICD-10-CM

## 2012-03-18 DIAGNOSIS — Z98891 History of uterine scar from previous surgery: Secondary | ICD-10-CM

## 2012-03-18 DIAGNOSIS — O212 Late vomiting of pregnancy: Secondary | ICD-10-CM | POA: Insufficient documentation

## 2012-03-18 LAB — URINALYSIS, ROUTINE W REFLEX MICROSCOPIC
Glucose, UA: NEGATIVE mg/dL
Hgb urine dipstick: NEGATIVE
Leukocytes, UA: NEGATIVE
Protein, ur: NEGATIVE mg/dL
Specific Gravity, Urine: 1.015 (ref 1.005–1.030)
pH: 7 (ref 5.0–8.0)

## 2012-03-18 MED ORDER — DIPHENOXYLATE-ATROPINE 2.5-0.025 MG PO TABS: 1.0000 | ORAL_TABLET | Freq: Four times a day (QID) | ORAL | Status: DC | PRN

## 2012-03-18 MED ORDER — DIPHENOXYLATE-ATROPINE 2.5-0.025 MG PO TABS
2.0000 | ORAL_TABLET | Freq: Once | ORAL | Status: AC
  Administered 2012-03-18: 2 via ORAL
  Filled 2012-03-18: qty 2

## 2012-03-18 MED ORDER — ONDANSETRON HCL 4 MG PO TABS
8.0000 mg | ORAL_TABLET | Freq: Once | ORAL | Status: AC
  Administered 2012-03-18: 8 mg via ORAL
  Filled 2012-03-18: qty 1

## 2012-03-18 MED ORDER — ONDANSETRON 8 MG PO TBDP: 8.0000 mg | ORAL_TABLET | Freq: Three times a day (TID) | ORAL | Status: DC | PRN
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
## 2012-03-18 NOTE — MAU Note (Signed)
Scheduled for repeat c-section 03/30/12

## 2012-03-18 NOTE — MAU Note (Signed)
C/oNausea and vomiting 0800 this Am; c/o stomach and back pain @ 0230 this Am; hx of 2 previous c-sections; ? Labor this AM;

## 2012-03-18 NOTE — MAU Note (Signed)
Patient states onset of vomiting, contractions, back pain since this morning, thinks in labor.

## 2012-03-24 ENCOUNTER — Inpatient Hospital Stay (HOSPITAL_COMMUNITY)
Admission: AD | Admit: 2012-03-24 | Discharge: 2012-03-28 | DRG: 765 | Disposition: A | Discharge status: Home / Self Care | Payer: Medicaid Other | Source: Ambulatory Visit | Attending: Obstetrics and Gynecology | Admitting: Obstetrics and Gynecology

## 2012-03-24 ENCOUNTER — Encounter (HOSPITAL_COMMUNITY): Payer: Self-pay | Admitting: *Deleted

## 2012-03-24 DIAGNOSIS — D696 Thrombocytopenia, unspecified: Secondary | ICD-10-CM | POA: Diagnosis present

## 2012-03-24 DIAGNOSIS — D689 Coagulation defect, unspecified: Secondary | ICD-10-CM | POA: Diagnosis present

## 2012-03-24 DIAGNOSIS — O09299 Supervision of pregnancy with other poor reproductive or obstetric history, unspecified trimester: Secondary | ICD-10-CM

## 2012-03-24 DIAGNOSIS — O09529 Supervision of elderly multigravida, unspecified trimester: Secondary | ICD-10-CM | POA: Diagnosis present

## 2012-03-24 DIAGNOSIS — O99345 Other mental disorders complicating the puerperium: Secondary | ICD-10-CM | POA: Diagnosis not present

## 2012-03-24 DIAGNOSIS — F411 Generalized anxiety disorder: Secondary | ICD-10-CM | POA: Diagnosis not present

## 2012-03-24 DIAGNOSIS — E039 Hypothyroidism, unspecified: Secondary | ICD-10-CM | POA: Diagnosis present

## 2012-03-24 DIAGNOSIS — R000 Tachycardia, unspecified: Secondary | ICD-10-CM | POA: Diagnosis present

## 2012-03-24 DIAGNOSIS — Z22330 Carrier of Group B streptococcus: Secondary | ICD-10-CM

## 2012-03-24 DIAGNOSIS — O99892 Other specified diseases and conditions complicating childbirth: Secondary | ICD-10-CM | POA: Diagnosis present

## 2012-03-24 DIAGNOSIS — F419 Anxiety disorder, unspecified: Secondary | ICD-10-CM

## 2012-03-24 DIAGNOSIS — Z98891 History of uterine scar from previous surgery: Secondary | ICD-10-CM

## 2012-03-24 DIAGNOSIS — O34219 Maternal care for unspecified type scar from previous cesarean delivery: Principal | ICD-10-CM | POA: Diagnosis present

## 2012-03-24 MED ORDER — LACTATED RINGERS IV BOLUS (SEPSIS)
1000.0000 mL | Freq: Once | INTRAVENOUS | Status: AC
  Administered 2012-03-24: 1000 mL via INTRAVENOUS

## 2012-03-24 NOTE — MAU Note (Signed)
Pt states she started having cramping and pains in there legs this AM.Pts states she is having contractions about Q 10 mins at this time

## 2012-03-24 NOTE — MAU Provider Note (Signed)
History     CSN: [id number]  Arrival date and time: 03/24/12 1951   None     Chief Complaint  Patient presents with  . Contractions   HPI Comments: Pt is a G3P2 w 1LC, at [redacted]w[redacted]d, scheduled for repeat c/s on 03-30-12. C/o wiping and having sm amt brown d/c x1 time this evening. She denies any VB, no LOF, GFM. Reports irreg ctx. Admits to drinking very little water today, last ate 7pm.  Pt is very anxious, which is a chronic problem, even when pt reassured, she continues to ask questions repeatedly.      Past Medical History  Diagnosis Date  . Hypothyroidism 2009    DR JAEJALI  . Depression     AFTER LOSS OF BABY;  NO MEDS  . Headache, migraine     IMPROVED  . Pregnant   . Hyperlipidemia     Past Surgical History  Procedure Date  . Cesarean section     Family History  Problem Relation Age of Onset  . Kidney disease Father     STONES  . Diabetes Maternal Uncle   . Congenital heart disease Daughter     died 39 days of age  . Birth defects Daughter     HEART DEFECT  . Hypertension Mother   . Arthritis Mother   . Heart disease Mother   . Hypothyroidism Mother   . Hypothyroidism Sister   . Birth defects Cousin     HYDOCEPHALIC    History  Substance Use Topics  . Smoking status: Never Smoker   . Smokeless tobacco: Never Used  . Alcohol Use: No    Allergies: No Known Allergies  Prescriptions prior to admission  Medication Sig Dispense Refill  . acetaminophen (TYLENOL) 500 MG tablet Take 500 mg by mouth daily as needed. For pain      . diphenoxylate-atropine (LOMOTIL) 2.5-0.025 MG per tablet Take 1 tablet by mouth 4 (four) times daily as needed for diarrhea or loose stools.  30 tablet  0  . levothyroxine (SYNTHROID, LEVOTHROID) 150 MCG tablet Take 150 mcg by mouth daily.      . ondansetron (ZOFRAN ODT) 8 MG disintegrating tablet Take 1 tablet (8 mg total) by mouth every 8 (eight) hours as needed for nausea.  20 tablet  0  . Prenatal Vit-Fe Fumarate-FA  (PRENATAL MULTIVITAMIN) TABS Take 1 tablet by mouth daily.        Review of Systems  Gastrointestinal: Negative for abdominal pain.  Genitourinary: Negative for dysuria.       Brown d/c x1 this evening   Psychiatric/Behavioral: The patient is nervous/anxious.   All other systems reviewed and are negative.   Physical Exam   Blood pressure 110/48, pulse 100, temperature 98.7 F (37.1 C), temperature source Oral, resp. rate 18, height 5' (1.524 m), weight 152 lb (68.947 kg), last menstrual period 06/20/2011, SpO2 100.00%.  Physical Exam  Nursing note and vitals reviewed. Constitutional: She is oriented to person, place, and time. She appears well-developed and well-nourished.  HENT:  Head: Normocephalic.  Eyes: Pupils are equal, round, and reactive to light.  Neck: Normal range of motion.  Cardiovascular: Normal rate, regular rhythm and normal heart sounds.   Respiratory: Effort normal and breath sounds normal.  GI: Soft. Bowel sounds are normal.  Genitourinary: Vagina normal.       Cervix 1/th/-2, scant light brown mucous d/c on glove,   Musculoskeletal: Normal range of motion.  Neurological: She is alert and oriented to person,  place, and time. She has normal reflexes.  Skin: Skin is warm and dry.  Psychiatric: She has a normal mood and affect. Her behavior is normal.   FHR 140 cat 1 toco irreg 2-7  MAU Course  Procedures    Assessment and Plan  IUP at [redacted]w[redacted]d FHR reassuring Hx c/s x2 Anxiety Likely dehydration  IVF's LR bolus 1000cc Reevaluate 1hr D/c home if FHR remains reassuring and ctx decrease D/w Dr Normand Sloop   Carmen West 03/24/2012, 8:42 PM   Addendum: 2300 Pt reports ctx are stronger than upon arrival  FHR reassuring cat 1 toco - regular 4-5 min, some coupling Cervix unchanged, station to -1  [redacted]w[redacted]d Persistent UC without cervical change Plans repeat c/s  Will d/w Dr Ward Givens, CNM   Addendum: 0981 After d/w Dr Normand Sloop, pt given  option of proceeding w c/s tonight or administer pain medication and observe overnight to rule out labor  Pt very indecisive,  Again: R/B of VBAC discussed and repeat C/S   After 30 minutes: pt asks for more time  Carmen West, CNM

## 2012-03-24 NOTE — MAU Note (Signed)
Pt reports contractions today, brownish discharge. Scheduled c/s for 12/16

## 2012-03-25 ENCOUNTER — Encounter (HOSPITAL_COMMUNITY)
Admission: AD | Disposition: A | Discharge status: Home / Self Care | Payer: Self-pay | Source: Ambulatory Visit | Attending: Obstetrics and Gynecology

## 2012-03-25 ENCOUNTER — Encounter (HOSPITAL_COMMUNITY): Payer: Self-pay | Admitting: Anesthesiology

## 2012-03-25 ENCOUNTER — Encounter: Payer: Medicaid Other | Admitting: Obstetrics and Gynecology

## 2012-03-25 ENCOUNTER — Encounter (HOSPITAL_COMMUNITY): Payer: Self-pay | Admitting: *Deleted

## 2012-03-25 ENCOUNTER — Inpatient Hospital Stay (HOSPITAL_COMMUNITY): Payer: Medicaid Other | Admitting: Anesthesiology

## 2012-03-25 DIAGNOSIS — O34219 Maternal care for unspecified type scar from previous cesarean delivery: Secondary | ICD-10-CM

## 2012-03-25 DIAGNOSIS — Z98891 History of uterine scar from previous surgery: Secondary | ICD-10-CM | POA: Diagnosis not present

## 2012-03-25 LAB — ABO/RH: ABO/RH(D): A POS

## 2012-03-25 LAB — CBC
HCT: 40.5 % (ref 36.0–46.0)
MCV: 93.5 fL (ref 78.0–100.0)
Platelets: 121 10*3/uL — ABNORMAL LOW (ref 150–400)
RBC: 4.33 MIL/uL (ref 3.87–5.11)
RDW: 13.8 % (ref 11.5–15.5)
WBC: 9.4 10*3/uL (ref 4.0–10.5)

## 2012-03-25 LAB — TYPE AND SCREEN
ABO/RH(D): A POS
Antibody Screen: NEGATIVE

## 2012-03-25 SURGERY — Surgical Case
Anesthesia: Spinal | Site: Abdomen | Wound class: Clean Contaminated

## 2012-03-25 MED ORDER — METOCLOPRAMIDE HCL 5 MG/ML IJ SOLN
10.0000 mg | Freq: Three times a day (TID) | INTRAMUSCULAR | Status: DC | PRN
  Administered 2012-03-26: 10 mg via INTRAVENOUS
  Filled 2012-03-25: qty 2

## 2012-03-25 MED ORDER — MEPERIDINE HCL 25 MG/ML IJ SOLN
INTRAMUSCULAR | Status: DC | PRN
  Administered 2012-03-25: 25 mg via INTRAVENOUS

## 2012-03-25 MED ORDER — IBUPROFEN 600 MG PO TABS: 600.0000 mg | ORAL_TABLET | Freq: Four times a day (QID) | ORAL | Status: DC | PRN

## 2012-03-25 MED ORDER — DIPHENHYDRAMINE HCL 25 MG PO CAPS: 25.0000 mg | ORAL_CAPSULE | ORAL | Status: DC | PRN

## 2012-03-25 MED ORDER — OXYTOCIN 40 UNITS IN LACTATED RINGERS INFUSION - SIMPLE MED: 62.5000 mL/h | INTRAVENOUS | Status: AC

## 2012-03-25 MED ORDER — BUPIVACAINE IN DEXTROSE 0.75-8.25 % IT SOLN
INTRATHECAL | Status: DC | PRN
  Administered 2012-03-25: 11 mg via INTRATHECAL

## 2012-03-25 MED ORDER — NALBUPHINE SYRINGE 5 MG/0.5 ML
5.0000 mg | INJECTION | INTRAMUSCULAR | Status: DC | PRN
Start: 2012-03-25 — End: 2012-03-28
  Filled 2012-03-25: qty 1

## 2012-03-25 MED ORDER — ONDANSETRON HCL 4 MG PO TABS: 4.0000 mg | ORAL_TABLET | ORAL | Status: DC | PRN

## 2012-03-25 MED ORDER — NALOXONE HCL 1 MG/ML IJ SOLN
1.0000 ug/kg/h | INTRAVENOUS | Status: DC | PRN
  Filled 2012-03-25: qty 2

## 2012-03-25 MED ORDER — KETOROLAC TROMETHAMINE 30 MG/ML IJ SOLN
INTRAMUSCULAR | Status: AC
  Filled 2012-03-25: qty 1

## 2012-03-25 MED ORDER — LEVOTHYROXINE SODIUM 150 MCG PO TABS
150.0000 ug | ORAL_TABLET | Freq: Every day | ORAL | Status: DC
  Administered 2012-03-25 – 2012-03-28 (×5): 150 ug via ORAL
  Filled 2012-03-25 (×5): qty 1

## 2012-03-25 MED ORDER — MENTHOL 3 MG MT LOZG: 1.0000 | LOZENGE | OROMUCOSAL | Status: DC | PRN

## 2012-03-25 MED ORDER — KETOROLAC TROMETHAMINE 30 MG/ML IJ SOLN
30.0000 mg | Freq: Four times a day (QID) | INTRAMUSCULAR | Status: AC | PRN
  Administered 2012-03-25: 30 mg via INTRAVENOUS

## 2012-03-25 MED ORDER — BISACODYL 10 MG RE SUPP: 10.0000 mg | Freq: Every day | RECTAL | Status: DC | PRN

## 2012-03-25 MED ORDER — FENTANYL CITRATE 0.05 MG/ML IJ SOLN
INTRAMUSCULAR | Status: DC | PRN
  Administered 2012-03-25: 25 ug via INTRATHECAL

## 2012-03-25 MED ORDER — FLEET ENEMA 7-19 GM/118ML RE ENEM: 1.0000 | ENEMA | Freq: Every day | RECTAL | Status: DC | PRN

## 2012-03-25 MED ORDER — TETANUS-DIPHTH-ACELL PERTUSSIS 5-2.5-18.5 LF-MCG/0.5 IM SUSP: 0.5000 mL | Freq: Once | INTRAMUSCULAR | Status: DC

## 2012-03-25 MED ORDER — ONDANSETRON HCL 4 MG/2ML IJ SOLN
INTRAMUSCULAR | Status: DC | PRN
  Administered 2012-03-25: 4 mg via INTRAVENOUS

## 2012-03-25 MED ORDER — ONDANSETRON HCL 4 MG/2ML IJ SOLN
4.0000 mg | INTRAMUSCULAR | Status: DC | PRN
  Administered 2012-03-26 (×2): 4 mg via INTRAVENOUS
  Filled 2012-03-25: qty 2

## 2012-03-25 MED ORDER — DIPHENHYDRAMINE HCL 25 MG PO CAPS: 25.0000 mg | ORAL_CAPSULE | Freq: Four times a day (QID) | ORAL | Status: DC | PRN

## 2012-03-25 MED ORDER — FENTANYL CITRATE 0.05 MG/ML IJ SOLN: 25.0000 ug | INTRAMUSCULAR | Status: DC | PRN

## 2012-03-25 MED ORDER — KETOROLAC TROMETHAMINE 30 MG/ML IJ SOLN: 30.0000 mg | Freq: Four times a day (QID) | INTRAMUSCULAR | Status: AC | PRN

## 2012-03-25 MED ORDER — ONDANSETRON HCL 4 MG/2ML IJ SOLN
4.0000 mg | Freq: Three times a day (TID) | INTRAMUSCULAR | Status: DC | PRN
  Administered 2012-03-26: 4 mg via INTRAVENOUS
  Filled 2012-03-25 (×2): qty 2

## 2012-03-25 MED ORDER — MEPERIDINE HCL 25 MG/ML IJ SOLN
INTRAMUSCULAR | Status: AC
  Filled 2012-03-25: qty 1

## 2012-03-25 MED ORDER — SIMETHICONE 80 MG PO CHEW: 80.0000 mg | CHEWABLE_TABLET | ORAL | Status: DC | PRN

## 2012-03-25 MED ORDER — MORPHINE SULFATE (PF) 0.5 MG/ML IJ SOLN
INTRAMUSCULAR | Status: DC | PRN
  Administered 2012-03-25: .15 mg via INTRATHECAL

## 2012-03-25 MED ORDER — MEPERIDINE HCL 25 MG/ML IJ SOLN
6.2500 mg | INTRAMUSCULAR | Status: AC | PRN
  Administered 2012-03-25 (×2): 6.25 mg via INTRAVENOUS

## 2012-03-25 MED ORDER — LACTATED RINGERS IV SOLN: INTRAVENOUS | Status: DC

## 2012-03-25 MED ORDER — LANOLIN HYDROUS EX OINT: 1.0000 "application " | TOPICAL_OINTMENT | CUTANEOUS | Status: DC | PRN

## 2012-03-25 MED ORDER — DIPHENHYDRAMINE HCL 50 MG/ML IJ SOLN: 12.5000 mg | INTRAMUSCULAR | Status: DC | PRN

## 2012-03-25 MED ORDER — SODIUM CHLORIDE 0.9 % IJ SOLN: 3.0000 mL | INTRAMUSCULAR | Status: DC | PRN

## 2012-03-25 MED ORDER — PRENATAL MULTIVITAMIN CH
1.0000 | ORAL_TABLET | Freq: Every day | ORAL | Status: DC
  Administered 2012-03-27 – 2012-03-28 (×2): 1 via ORAL
  Filled 2012-03-25 (×2): qty 1

## 2012-03-25 MED ORDER — CITRIC ACID-SODIUM CITRATE 334-500 MG/5ML PO SOLN
30.0000 mL | Freq: Once | ORAL | Status: AC
  Administered 2012-03-25: 30 mL via ORAL
  Filled 2012-03-25: qty 15

## 2012-03-25 MED ORDER — SIMETHICONE 80 MG PO CHEW
80.0000 mg | CHEWABLE_TABLET | Freq: Three times a day (TID) | ORAL | Status: DC
  Administered 2012-03-25 – 2012-03-28 (×12): 80 mg via ORAL

## 2012-03-25 MED ORDER — OXYCODONE-ACETAMINOPHEN 5-325 MG PO TABS
1.0000 | ORAL_TABLET | ORAL | Status: DC | PRN
  Administered 2012-03-26 (×2): 1 via ORAL
  Administered 2012-03-26: 2 via ORAL
  Administered 2012-03-26: 1 via ORAL
  Filled 2012-03-25 (×3): qty 1
  Filled 2012-03-25: qty 2

## 2012-03-25 MED ORDER — CEFAZOLIN SODIUM-DEXTROSE 2-3 GM-% IV SOLR
2.0000 g | INTRAVENOUS | Status: AC
  Administered 2012-03-25: 2 g via INTRAVENOUS
  Filled 2012-03-25 (×3): qty 50

## 2012-03-25 MED ORDER — WITCH HAZEL-GLYCERIN EX PADS: 1.0000 "application " | MEDICATED_PAD | CUTANEOUS | Status: DC | PRN

## 2012-03-25 MED ORDER — PHENYLEPHRINE HCL 10 MG/ML IJ SOLN
INTRAMUSCULAR | Status: DC | PRN
  Administered 2012-03-25 (×5): 80 ug via INTRAVENOUS

## 2012-03-25 MED ORDER — IBUPROFEN 600 MG PO TABS
600.0000 mg | ORAL_TABLET | Freq: Four times a day (QID) | ORAL | Status: DC
  Administered 2012-03-25 – 2012-03-28 (×13): 600 mg via ORAL
  Filled 2012-03-25 (×13): qty 1

## 2012-03-25 MED ORDER — LACTATED RINGERS IV SOLN
INTRAVENOUS | Status: DC | PRN
  Administered 2012-03-25: 02:00:00 via INTRAVENOUS

## 2012-03-25 MED ORDER — SENNOSIDES-DOCUSATE SODIUM 8.6-50 MG PO TABS
2.0000 | ORAL_TABLET | Freq: Every day | ORAL | Status: DC
  Administered 2012-03-25 – 2012-03-27 (×2): 2 via ORAL

## 2012-03-25 MED ORDER — 0.9 % SODIUM CHLORIDE (POUR BTL) OPTIME
TOPICAL | Status: DC | PRN
  Administered 2012-03-25: 3000 mL

## 2012-03-25 MED ORDER — MEASLES, MUMPS & RUBELLA VAC ~~LOC~~ INJ
0.5000 mL | INJECTION | Freq: Once | SUBCUTANEOUS | Status: DC
  Filled 2012-03-25: qty 0.5

## 2012-03-25 MED ORDER — NALOXONE HCL 0.4 MG/ML IJ SOLN: 0.4000 mg | INTRAMUSCULAR | Status: DC | PRN

## 2012-03-25 MED ORDER — ZOLPIDEM TARTRATE 5 MG PO TABS
5.0000 mg | ORAL_TABLET | Freq: Every evening | ORAL | Status: DC | PRN
Start: 2012-03-25 — End: 2012-03-28

## 2012-03-25 MED ORDER — FERROUS SULFATE 325 (65 FE) MG PO TABS
325.0000 mg | ORAL_TABLET | Freq: Two times a day (BID) | ORAL | Status: DC
  Administered 2012-03-25 – 2012-03-28 (×5): 325 mg via ORAL
  Filled 2012-03-25 (×5): qty 1

## 2012-03-25 MED ORDER — LACTATED RINGERS IV SOLN
40.0000 [IU] | INTRAVENOUS | Status: DC | PRN
  Administered 2012-03-25: 40 [IU] via INTRAVENOUS

## 2012-03-25 MED ORDER — DIPHENHYDRAMINE HCL 50 MG/ML IJ SOLN: 25.0000 mg | INTRAMUSCULAR | Status: DC | PRN

## 2012-03-25 MED ORDER — DIBUCAINE 1 % RE OINT: 1.0000 "application " | TOPICAL_OINTMENT | RECTAL | Status: DC | PRN

## 2012-03-25 MED ORDER — LACTATED RINGERS IV SOLN
INTRAVENOUS | Status: DC | PRN
  Administered 2012-03-25: 01:00:00 via INTRAVENOUS

## 2012-03-25 SURGICAL SUPPLY — 40 items
BENZOIN TINCTURE PRP APPL 2/3 (GAUZE/BANDAGES/DRESSINGS) ×2 IMPLANT
CLOTH BEACON ORANGE TIMEOUT ST (SAFETY) ×2 IMPLANT
CONTAINER PREFILL 10% NBF 15ML (MISCELLANEOUS) IMPLANT
DRAIN JACKSON PRT FLT 10 (DRAIN) IMPLANT
DRAPE LG THREE QUARTER DISP (DRAPES) ×2 IMPLANT
DRSG OPSITE POSTOP 4X10 (GAUZE/BANDAGES/DRESSINGS) IMPLANT
DURAPREP 26ML APPLICATOR (WOUND CARE) ×2 IMPLANT
ELECT REM PT RETURN 9FT ADLT (ELECTROSURGICAL) ×2
ELECTRODE REM PT RTRN 9FT ADLT (ELECTROSURGICAL) ×1 IMPLANT
EVACUATOR SILICONE 100CC (DRAIN) IMPLANT
EXTRACTOR VACUUM M CUP 4 TUBE (SUCTIONS) ×2 IMPLANT
GLOVE BIO SURGEON STRL SZ 6.5 (GLOVE) ×2 IMPLANT
GLOVE BIOGEL PI IND STRL 7.0 (GLOVE) ×1 IMPLANT
GLOVE BIOGEL PI INDICATOR 7.0 (GLOVE) ×1
GOWN PREVENTION PLUS LG XLONG (DISPOSABLE) ×6 IMPLANT
HEMOSTAT SURGICEL 2X14 (HEMOSTASIS) ×2 IMPLANT
HEMOSTAT SURGICEL 2X3 (HEMOSTASIS) ×2 IMPLANT
KIT ABG SYR 3ML LUER SLIP (SYRINGE) IMPLANT
NEEDLE HYPO 25X5/8 SAFETYGLIDE (NEEDLE) IMPLANT
NS IRRIG 1000ML POUR BTL (IV SOLUTION) ×6 IMPLANT
PACK C SECTION WH (CUSTOM PROCEDURE TRAY) ×2 IMPLANT
PAD OB MATERNITY 4.3X12.25 (PERSONAL CARE ITEMS) IMPLANT
RTRCTR C-SECT PINK 25CM LRG (MISCELLANEOUS) ×2 IMPLANT
SLEEVE SCD COMPRESS KNEE MED (MISCELLANEOUS) ×2 IMPLANT
SPONGE LAP 18X18 X RAY DECT (DISPOSABLE) ×2 IMPLANT
STAPLER VISISTAT 35W (STAPLE) IMPLANT
STRIP CLOSURE SKIN 1/2X4 (GAUZE/BANDAGES/DRESSINGS) ×2 IMPLANT
SUT CHROMIC 0 CT 1 (SUTURE) ×2 IMPLANT
SUT MNCRL AB 3-0 PS2 27 (SUTURE) ×2 IMPLANT
SUT PLAIN 2 0 (SUTURE)
SUT PLAIN 2 0 XLH (SUTURE) ×2 IMPLANT
SUT PLAIN ABS 2-0 CT1 27XMFL (SUTURE) IMPLANT
SUT SILK 2 0 SH (SUTURE) IMPLANT
SUT VIC AB 0 CTX 36 (SUTURE) ×7
SUT VIC AB 0 CTX36XBRD ANBCTRL (SUTURE) ×7 IMPLANT
SUT VIC AB 2-0 SH 27 (SUTURE) ×1
SUT VIC AB 2-0 SH 27XBRD (SUTURE) ×1 IMPLANT
TOWEL OR 17X24 6PK STRL BLUE (TOWEL DISPOSABLE) ×6 IMPLANT
TRAY FOLEY CATH 14FR (SET/KITS/TRAYS/PACK) ×2 IMPLANT
WATER STERILE IRR 1000ML POUR (IV SOLUTION) IMPLANT

## 2012-03-25 NOTE — Progress Notes (Signed)
Date of Initial H&P: 03/25/12  History reviewed, patient examined, no change in status, stable for surgery. R&B reviewed with the pt

## 2012-03-25 NOTE — H&P (Signed)
Carmen West is a 36 y.o. female presenting for labor eval. Ctx have persisted since early this evening, despite rest and 1liter IVF's. Pt has hx prior c/s x2. She desires repeat cesarean.   HPI: Pt began PNC at [facility] at 12wks. EDC per LMP =03/26/12. She had viability [place] at that time w EDC changed to 04/01/12 based on early [place]. Pt had normal thyroid studies throughout pregnancy. She had normal genetic screens. Anatomy scan at 17wks normal. Fetal echocardiogram was done and normal. 1hr gtt elevated and 3hr gtt WNL. Pt was seen by cardiology for persistent tachycardia, she had normal cardiac studies. Growth US done at 36wks was normal. She had +GBS    Maternal Medical History:  Reason for admission: Reason for admission: contractions.  Contractions: Onset was 6-12 hours ago.   Frequency: regular.   Duration is approximately 60 seconds.   Perceived severity is moderate.    Fetal activity: Perceived fetal activity is normal.   Last perceived fetal movement was within the past hour.    Prenatal complications: Thrombocytopenia.     OB History    Grav Para Term Preterm Abortions TAB SAB Ect Mult Living   3 2 2       1      Past Medical History  Diagnosis Date  . Hypothyroidism 2009    DR JAEJALI  . Depression     AFTER LOSS OF BABY;  NO MEDS  . Headache, migraine     IMPROVED  . Pregnant   . Hyperlipidemia    Past Surgical History  Procedure Date  . Cesarean section    Family History: family history includes Arthritis in her mother; Birth defects in her cousin and daughter; Congenital heart disease in her daughter; Diabetes in her maternal uncle; Heart disease in her mother; Hypertension in her mother; Hypothyroidism in her mother and sister; and Kidney disease in her father. Social History:  reports that she has never smoked. She has never used smokeless tobacco. She reports that she does not drink alcohol or use illicit drugs.   Prenatal Transfer Tool  Maternal Diabetes:  No Genetic Screening: Normal Maternal Ultrasounds/Referrals: Normal Fetal Ultrasounds or other Referrals:  Fetal echo Maternal Substance Abuse:  No Significant Maternal Medications:  None Significant Maternal Lab Results:  Lab values include: Group B Strep negative Other Comments:  None  Review of Systems  Musculoskeletal: Positive for myalgias.       C/o feeling ctx in upper thighs  Psychiatric/Behavioral: The patient is nervous/anxious.   All other systems reviewed and are negative.    Dilation: 1 Effacement (%): Thick Blood pressure 138/79, pulse 97, temperature 98.7 F (37.1 C), temperature source Oral, resp. rate 18, height 5' (1.524 m), weight 152 lb (68.947 kg), last menstrual period 06/20/2011, SpO2 100.00%. Maternal Exam:  Uterine Assessment: Contraction strength is moderate.  Contraction frequency is regular.   Abdomen: Patient reports no abdominal tenderness. Fundal height is aga.   Estimated fetal weight is 7.   Fetal presentation: vertex  Introitus: Normal vulva. Normal vagina.  Pelvis: adequate for delivery.   Cervix: Cervix evaluated by digital exam.     Fetal Exam Fetal Monitor Review: Mode: ultrasound.   Baseline rate: 140.  Variability: moderate (6-25 bpm).   Pattern: accelerations present and early decelerations.    Fetal State Assessment: Category I - tracings are normal.     Physical Exam  Nursing note and vitals reviewed. Constitutional: She is oriented to person, place, and time. She appears well-developed  and well-nourished.  HENT:  Head: Normocephalic.  Eyes: Pupils are equal, round, and reactive to light.  Neck: Normal range of motion.  Cardiovascular: Normal rate, regular rhythm and normal heart sounds.   Respiratory: Effort normal and breath sounds normal.  GI: Soft. Bowel sounds are normal.  Genitourinary: Vagina normal.  Musculoskeletal: Normal range of motion.  Neurological: She is alert and oriented to person, place, and time. She  has normal reflexes.  Skin: Skin is warm and dry.  Psychiatric: She has a normal mood and affect. Her behavior is normal.    Prenatal labs: ABO, Rh: A/POS/-- (06/27 1445) Antibody: NEG (06/27 1445) Rubella: >500.0 (06/27 1445) RPR: NON REACTIVE (10/07 1421)  HBsAg: NEGATIVE (06/27 1445)  HIV: NON REACTIVE (06/27 1445)  GBS: POSITIVE (11/21 1444)   Assessment/Plan: IUP at 39wks GBS pos Early labor FHR reassuring   Admit to pre-op per c/w Dr Normand Sloop Routine pre-op orders Prepare for cesarean Dr Normand Sloop has notified OR   Jalene Lacko M 03/25/2012, 12:20 AM

## 2012-03-25 NOTE — Anesthesia Postprocedure Evaluation (Signed)
  Anesthesia Post-op Note  Patient: Carmen West  Procedure(s) Performed: Procedure(s) (LRB) with comments: CESAREAN SECTION (N/A) - Repeat Cesarean Section Delivery Girl @ 0158, Apgars 7/9  Patient Location: PACU  Anesthesia Type:Spinal  Level of Consciousness: awake, alert  and oriented  Airway and Oxygen Therapy: Patient Spontanous Breathing  Post-op Pain: mild  Post-op Assessment: Post-op Vital signs reviewed, Patient's Cardiovascular Status Stable, Respiratory Function Stable, Patent Airway, No signs of Nausea or vomiting, Pain level controlled, No headache, No backache, No residual numbness and No residual motor weakness  Post-op Vital Signs: Reviewed and stable  Complications: No apparent anesthesia complications

## 2012-03-25 NOTE — Progress Notes (Signed)
Subjective: Postpartum Day 0 Cesarean Delivery Patient reports tolerating PO.   S: comfortable, little bleeding, slept     Feeding Hemoglobin & Hematocrit     Component Value Date/Time   HGB 13.6 03/25/2012 0010   HCT 40.5 03/25/2012 0010    Temp:  [98 F (36.7 C)-98.9 F (37.2 C)] 98 F (36.7 C) (12/11 1300) Pulse Rate:  [67-100] 80  (12/11 1300) Resp:  [17-32] 18  (12/11 1300) BP: (109-138)/(47-79) 117/60 mmHg (12/11 1300) SpO2:  [98 %-100 %] 100 % (12/11 1300) Weight:  [152 lb (68.947 kg)] 152 lb (68.947 kg) (12/10 1958)  Physical Exam:  General: cooperative and fatigued Lochia: appropriate Uterine Fundus: firm Incision: with dry dressing DVT Evaluation: Negative Homan's sign. No significant calf/ankle edema. Lungs clear bilaterally AP RRR Bowel sounds active abd    nontympanic     A normal involution     Lactating     PO day      Normal involution P continue care Lavera Guise, CNM

## 2012-03-25 NOTE — Anesthesia Procedure Notes (Signed)
Spinal  Patient location during procedure: OR Start time: 03/25/2012 1:24 AM Staffing Anesthesiologist: Lateefah Mallery A. Performed by: anesthesiologist  Preanesthetic Checklist Completed: patient identified, site marked, surgical consent, pre-op evaluation, timeout performed, IV checked, risks and benefits discussed and monitors and equipment checked Spinal Block Patient position: sitting Prep: site prepped and draped and DuraPrep Patient monitoring: heart rate, cardiac monitor, continuous pulse ox and blood pressure Approach: midline Location: L3-4 Injection technique: single-shot Needle Needle type: Sprotte  Needle gauge: 24 G Needle length: 9 cm Needle insertion depth: 4 cm Assessment Sensory level: T4 Additional Notes Patient tolerated procedure well. Adequate sensory level.

## 2012-03-25 NOTE — Anesthesia Preprocedure Evaluation (Signed)
Anesthesia Evaluation  Patient identified by MRN, date of birth, ID band Patient awake    Reviewed: Allergy & Precautions, H&P , NPO status , Patient's Chart, lab work & pertinent test results  Airway Mallampati: III TM Distance: >3 FB Neck ROM: Full    Dental No notable dental hx. (+) Teeth Intact   Pulmonary neg pulmonary ROS,  breath sounds clear to auscultation  Pulmonary exam normal       Cardiovascular negative cardio ROS  Rhythm:Regular Rate:Normal     Neuro/Psych  Headaches, PSYCHIATRIC DISORDERS Anxiety Depression    GI/Hepatic Neg liver ROS, GERD-  ,  Endo/Other  Hypothyroidism Hyperlipidemia  Renal/GU negative Renal ROS  negative genitourinary   Musculoskeletal negative musculoskeletal ROS (+)   Abdominal (+) - obese,   Peds  Hematology negative hematology ROS (+)   Anesthesia Other Findings   Reproductive/Obstetrics (+) Pregnancy Previous C/Section x 2                           Anesthesia Physical Anesthesia Plan  ASA: II and emergent  Anesthesia Plan: Spinal   Post-op Pain Management:    Induction:   Airway Management Planned: Natural Airway  Additional Equipment:   Intra-op Plan:   Post-operative Plan:   Informed Consent: I have reviewed the patients History and Physical, chart, labs and discussed the procedure including the risks, benefits and alternatives for the proposed anesthesia with the patient or authorized representative who has indicated his/her understanding and acceptance.   Dental advisory given  Plan Discussed with: CRNA, Anesthesiologist and Surgeon  Anesthesia Plan Comments:         Anesthesia Quick Evaluation

## 2012-03-25 NOTE — Progress Notes (Signed)
UR chart review completed.  

## 2012-03-25 NOTE — Progress Notes (Signed)
After spinal

## 2012-03-25 NOTE — Op Note (Signed)
Cesarean Section Procedure Note   Carmen West  03/24/2012 - 03/25/2012  Indications: Scheduled Proceedure/Maternal Request and contractions   Pre-operative Diagnosis: * No pre-op diagnosis entered *.   Post-operative Diagnosis: Same   Surgeon: Surgeon(s) and Role:    * Michael Litter, MD - Primary   Assistants: Gevena Barre  Anesthesia: spinal   Procedure Details:  The patient was seen in the Holding Room. The risks, benefits, complications, treatment options, and expected outcomes were discussed with the patient. The patient concurred with the proposed plan, giving informed consent. identified as [employer] and the procedure verified as C-Section Delivery. A Time Out was held and the above information confirmed.  After induction of anesthesia, the patient was draped and prepped in the usual sterile manner. A transverse incision was made and carried down through the subcutaneous tissue to the fascia. I made my incision along her previous incision.   Fascial incision was made in the midline and extended transversely. The fascia was separated from the underlying rectus muscle superiorly and inferiorly. The muscles were separated in the midline using metzenbaums.   The peritoneum was identified and entered. Peritoneal incision was extended longitudinally with good visualization of bowel and bladder. The utero-vesical peritoneal reflection was incised transversely and the bladder flap was bluntly freed from the lower uterine segment. Bladder blade was placed .   A low transverse uterine incision was made. Delivered from cephalic presentation was a  infant, with Apgar scores of 7 at one minute and 9 at five minutes.I had to cut the muscle on the right side to make room and use the vacuum to deliver the infants head.there were three pulls and two pop offs.  All done in the green zone.  Nuchal cord times one reduced.     Cord ph was not sent the umbilical cord was clamped and cut cord blood was  obtained for evaluation. The placenta was removed Intact and appeared normal. The uterine outline, tubes and ovaries appeared normal}. The uterine incision was closed with running locked sutures of 0Vicryl. A second layer 0 vicrlyl was used to imbricate the uterine incision.  Several figure of 8 stitches with 0 and 2-0 vicryl were needed to obtain hemostasis.      Hemostasis was observed. Lavage was carried out until clear. Surgicel was placed along the incision.  The peritoneum was closed with 0 chromic.  The muscles were examined and any bleeders were made hemostatic using bovie cautery device.   The fascia was then reapproximated with running sutures of 0 vicryl.  The subcutaneous tissue was reapproximated  With interrupted stitches using 2-0 plain gut. The subcuticular closure was performed using 3-11monocryl     Instrument, sponge, and needle counts were correct prior the abdominal closure and were correct at the conclusion of the case.    Findings: infant was delivered from vtx presentation. The fluid was clear.  The uterus tubes and ovaries appeared normal.  Several adhesions from the uterine serosa to the anterior abdominal wall   Estimated Blood Loss:  Total IV Fluids:   Urine Output: 300CC OF clear urine  Specimens: @ORSPECIMEN @   Complications: no complications  Disposition: PACU - hemodynamically stable.   Maternal Condition: stable   Baby condition / location:  nursery-stable  Attending Attestation: I was present and scrubbed for the entire procedure.   Signed: Surgeon(s): Michael Litter, MD

## 2012-03-25 NOTE — Transfer of Care (Signed)
Immediate Anesthesia Transfer of Care Note  Patient: Carmen West  Procedure(s) Performed: Procedure(s) (LRB) with comments: CESAREAN SECTION (N/A)  Patient Location: PACU  Anesthesia Type:Spinal  Level of Consciousness: awake, alert , oriented and patient cooperative  Airway & Oxygen Therapy: Patient Spontanous Breathing  Post-op Assessment: Report given to PACU RN and Post -op Vital signs reviewed and stable  Post vital signs: Reviewed and stable  Complications: No apparent anesthesia complications

## 2012-03-25 NOTE — Progress Notes (Signed)
CSW attempted to met with pt however was asked to return at later time. Pt accompanied by unknown female. CSW met with pt in MAU during pregnancy and is familiar with this pt situation. CSW will return and complete full assessment when pt alone.

## 2012-03-26 ENCOUNTER — Encounter (HOSPITAL_COMMUNITY): Payer: Self-pay | Admitting: Obstetrics and Gynecology

## 2012-03-26 LAB — CBC
Hemoglobin: 11.7 g/dL — ABNORMAL LOW (ref 12.0–15.0)
MCV: 93.5 fL (ref 78.0–100.0)
Platelets: 108 10*3/uL — ABNORMAL LOW (ref 150–400)
RBC: 3.7 MIL/uL — ABNORMAL LOW (ref 3.87–5.11)
WBC: 10.6 10*3/uL — ABNORMAL HIGH (ref 4.0–10.5)

## 2012-03-26 MED ORDER — ALPRAZOLAM 0.25 MG PO TABS: 0.2500 mg | ORAL_TABLET | Freq: Two times a day (BID) | ORAL | Status: DC | PRN

## 2012-03-26 MED ORDER — HYDROXYZINE HCL 50 MG PO TABS
50.0000 mg | ORAL_TABLET | Freq: Three times a day (TID) | ORAL | Status: DC | PRN
  Administered 2012-03-26: 50 mg via ORAL
  Filled 2012-03-26: qty 1

## 2012-03-26 MED ORDER — SERTRALINE HCL 50 MG PO TABS
50.0000 mg | ORAL_TABLET | Freq: Every day | ORAL | Status: DC
  Filled 2012-03-26 (×4): qty 1

## 2012-03-26 NOTE — Progress Notes (Signed)
Subjective: Postpartum Day 1: Cesarean Delivery Patient reports no problems.  She ws upset last night because the baby had an episode of appearing "blue" but she is better now.  RN asked to write order for xanax bc pt had anxiety last night.  Pt currently does not feel like she needs xanax.  Otherwise she is doing well.  Objective: Vital signs in last 24 hours: Temp:  [98 F (36.7 C)-99.5 F (37.5 C)] 98.6 F (37 C) (12/12 0222) Pulse Rate:  [66-113] 66  (12/12 0222) Resp:  [18-24] 20  (12/12 0222) BP: (114-138)/(60-105) 123/67 mmHg (12/12 0222) SpO2:  [99 %-100 %] 99 % (12/12 0222)  Physical Exam:  General: alert and no distress Lochia: appropriate Uterine Fundus: firm, NT Incision: dressing in place DVT Evaluation: No evidence of DVT seen on physical exam. No cords or calf tenderness.   Basename 03/26/12 0115 03/25/12 0010  HGB 11.7* 13.6  HCT 34.6* 40.5    Assessment/Plan: Status post Cesarean section. Doing well postoperatively.  Continue current care Will order xanax for prn   Alexandria Shiflett Y 03/26/2012, 9:47 AM

## 2012-03-27 ENCOUNTER — Other Ambulatory Visit (HOSPITAL_COMMUNITY): Payer: Self-pay

## 2012-03-27 MED ORDER — HYDROXYZINE HCL 25 MG PO TABS
25.0000 mg | ORAL_TABLET | Freq: Three times a day (TID) | ORAL | Status: DC | PRN
  Filled 2012-03-27: qty 1

## 2012-03-27 MED ORDER — HYDROXYZINE HCL 25 MG PO TABS
25.0000 mg | ORAL_TABLET | Freq: Three times a day (TID) | ORAL | Status: DC | PRN
  Filled 2012-03-27: qty 2

## 2012-03-27 NOTE — Progress Notes (Addendum)
Subjective: Postpartum Day 2 Cesarean Delivery Patient reports tolerating PO, + flatus and no problems voiding pain medication is working, comfortable, little bleeding, slept breastfeeding Hemoglobin & Hematocrit     Component Value Date/Time   HGB 11.7* 03/26/2012 0115   HCT 34.6* 03/26/2012 0115    Temp:  [98 F (36.7 C)-98.9 F (37.2 C)] 98.9 F (37.2 C) (12/13 0645) Pulse Rate:  [60-84] 84  (12/13 0645) Resp:  [18] 18  (12/13 0645) BP: (107-113)/(67-71) 111/71 mmHg (12/13 0645) SpO2:  [100 %] 100 % (12/12 1002)  Physical Exam:  General: alert, appears stated age and no distress Lochia: appropriate Uterine Fundus: firm Incision: with dressing intact without drainage DVT Evaluation: Negative Homan's sign. No significant calf/ankle edema. Lungs clear bilaterally AP RRR Bowel sounds hypoactive abd softly tympanic     A normal involution     Lactating     PO day 2     Normal involution P encouraged shower, ambulation, continue care Lavera Guise, CNM C/o of breast lump she noticed 2 days ago Bilateral breast exam with no lumps dimpling or drainage with exception R breasts 7 o'clock 2 cm below areola with 1/2 cm size superficial area nontender, no redness, plan ressess at pp visit discussed. Lavera Guise, CNM

## 2012-03-28 MED ORDER — OXYCODONE-ACETAMINOPHEN 5-325 MG PO TABS: 1.0000 | ORAL_TABLET | Freq: Four times a day (QID) | ORAL | Status: DC | PRN

## 2012-03-28 MED ORDER — SERTRALINE HCL 50 MG PO TABS: 50.0000 mg | ORAL_TABLET | Freq: Every day | ORAL | Status: DC

## 2012-03-28 MED ORDER — IBUPROFEN 600 MG PO TABS: 600.0000 mg | ORAL_TABLET | Freq: Four times a day (QID) | ORAL | Status: DC

## 2012-03-28 NOTE — Clinical Social Work Note (Signed)
Clinical Social Work Department PSYCHOSOCIAL ASSESSMENT - MATERNAL/CHILD 03/28/2012  Patient:  Carmen West, Carmen West  Account Number:  [id number]  Admit Date:  03/24/2012  Marjo Bicker Name:   Carmen West    Clinical Social Worker:  Truman Hayward, LCSW   Date/Time:  03/28/2012 11:00 AM  Date Referred:  03/28/2012   Referral source  Physician  RN     Referred reason  Depression/Anxiety   Other referral source:    I:  FAMILY / HOME ENVIRONMENT Child's legal guardian:  PARENT  Guardian - Name Guardian - Age Guardian - Address  [address] [phone] [place], [place] [address]  [place]  [phone] [place], [place] [address]   Other household support members/support persons Name Relationship DOB  Pharrah Bouyanhya SISTER 39 years old   Other support:    II  PSYCHOSOCIAL DATA Information Source:  Patient Interview  Event organiser Employment:   Surveyor, quantity resources:  [employer] If Medicaid - County:  [place] Other  WIC   School / Grade:   Maternity Care Coordinator / Child Services Coordination / Early Interventions:  Cultural issues impacting care:    III  STRENGTHS Strengths  Adequate Resources  Supportive family/friends  Compliance with medical plan  Home prepared for Child (including basic supplies)   Strength comment:    IV  RISK FACTORS AND CURRENT PROBLEMS Current Problem:  None   Risk Factor & Current Problem Patient Issue Family Issue Risk Factor / Current Problem Comment   N N     V  SOCIAL WORK ASSESSMENT CSW spoke with MOB in room.  MOB discussed hx of SIDs with previous pregnancy and how that is effecting her anxiety currently.  MOB reports she would like counseling services in the home and CSW provided MOB with information on how to begin these services.  CSW discussed medication prescribed, and MOB reports she does not want to start taking medication until she has tried counseling first.  MOB  reports having counseling in her home country when infant passed and MOB reported success with controlling depression with no medication.  MOB reports no significant symptoms at this time of anxiety.  CSW discussed supplies and family support.  MOB reports FOB will be home for the first week and her friend is coming into the country to stay with her after for support.  CSW instructed MOB to let RN know if any other concerns arise.      VI SOCIAL WORK PLAN Social Work Plan  No Further Intervention Required / No Barriers to Discharge   Type of pt/family education:   If child protective services report - county:   If child protective services report - date:   Information/referral to community resources comment:   Other social work plan:

## 2012-03-28 NOTE — Discharge Summary (Signed)
Obstetric Discharge Summary Reason for Admission: cesarean section Prenatal Procedures: ultrasound Intrapartum Procedures: cesarean: low cervical, transverse Postpartum Procedures: none Complications-Operative and Postpartum: none  Admitted via MAU due to persistent uterine contractions in early labor for repeat C/S.  Pt underwent repeat C/S without complications.  Postoperative course uneventful.  Remains afebrile and tol po liquids and solids without difficulty prior to discharge.  Vital signs stable.   Hemoglobin  Date Value Range Status  03/26/2012 11.7* 12.0 - 15.0 g/dL Final     HCT  Date Value Range Status  03/26/2012 34.6* 36.0 - 46.0 % Final    Physical Exam:  General: alert, cooperative and no distress Heart: RRR Lungs:  CTA bilat Abd:  Soft, NT with pos BS x 4 quads.   Lochia: appropriate sm rubra Uterine Fundus: firm, NT 1 below umbilicus Incision: healing well, steri strips intact DVT Evaluation: No evidence of DVT seen on physical exam. Negative Homan's sign bilat. No significant calf/ankle edema. Breasts soft with glandular changes.  No discrete mass noted.  Discharge Diagnoses: Term Pregnancy-delivered                                         Repeat LTCS                                         Maternal anxiety  Discharge Information: Date: 03/28/2012 Activity: unrestricted Diet: routine Medications: Ibuprofen, Percocet.  Pt was prescribed Zoloft due to anxiety during hospitalization but she has refused to take the med and after RBA d/w pt, she declines to use.  Condition: stable Instructions: refer to practice specific booklet Discharge to: home Follow-up Information    Follow up with [facility]. Call in 6 weeks. (Call for an appointment to be seen in 6 weeks.)    Contact information:   [address] [place] [address]          Newborn Data: Live born female  Birth Weight: 6 lb 15.5 oz (3160 g) APGAR: 7,  9  Home with mother.  Carmen Ringstad O. 03/28/2012, 12:59 PM

## 2012-03-28 NOTE — Clinical Social Work Note (Signed)
CSW assessed MOB and spoke with MD.  No concerns with discharge at this time.  Full consult report to follow.    [phone] 

## 2012-03-28 NOTE — Progress Notes (Signed)
Subjective: Postpartum Day 3: Cesarean Delivery Patient reports feeling well.  Reports pain well controlled with medications.  Ambulating, voiding and tol po liquids and solids without difficulty.  Breastfeeding without difficulty.  Pos flatus, Pos BM.    Objective: Vital signs in last 24 hours: Temp:  [97.9 F (36.6 C)-98.2 F (36.8 C)] 98.2 F (36.8 C) (12/13 2217) Pulse Rate:  [61-71] 61  (12/14 0900) Resp:  [18] 18  (12/13 2217) BP: (112-136)/(72-85) 121/78 mmHg (12/14 0900) Filed Vitals:   03/27/12 0645 03/27/12 1414 03/27/12 2217 03/28/12 0900  BP: 111/71 112/72 136/85 121/78  Pulse: 84 69 71 61  Temp: 98.9 F (37.2 C) 97.9 F (36.6 C) 98.2 F (36.8 C)   TempSrc: Oral Oral Oral   Resp: 18 18 18    Height:      Weight:      SpO2:       Physical Exam:  General: alert, cooperative and no distress Skin W&D. Color satis. Heart:  RRR Lungs:  CTA bilat Abd:  Soft, NT, pos BS x 4 quads.   Lochia: appropriate, sm rubra Uterine Fundus: firm, NT 1 below umb Incision: healing well.  Sm amt old bloody drainage on steristrips.  No redness or drainage noted. DVT Evaluation: No evidence of DVT seen on physical exam. Negative Homan's sign bilat. No significant calf/ankle edema.   Basename 03/26/12 0115  HGB 11.7*  HCT 34.6*    Assessment/Plan: Status post Cesarean section. Doing well postoperatively.    Discharge home with standard precautions and return to clinic in 4-6 weeks. Discharge instructions reviewed. Undecided regarding contraception. Rx Motrin, Percocet.    Carmen West. 03/28/2012, 12:49 PM

## 2012-03-29 ENCOUNTER — Telehealth: Payer: Self-pay | Admitting: Obstetrics and Gynecology

## 2012-03-29 NOTE — Telephone Encounter (Signed)
TC from patient--needed clarification regarding previous call re:  Use of Fleets. Reviewed instructions again, and talked patient through insertion, retention, etc. F/U prn.

## 2012-03-29 NOTE — Telephone Encounter (Signed)
TC from patient--s/p repeat LTCS on 12/11.  Had BM in hospital, but none in last 2 days. Feels very constipated.  Used suppository this am without results.  Recommended Fleet's enema and stool softeners BID, fluids, fruits and vegetables.  To call if no results.

## 2012-03-30 ENCOUNTER — Inpatient Hospital Stay (HOSPITAL_COMMUNITY)
Admission: RE | Admit: 2012-03-30 | Payer: Medicaid Other | Source: Ambulatory Visit | Admitting: Obstetrics and Gynecology

## 2012-03-30 ENCOUNTER — Encounter (HOSPITAL_COMMUNITY): Admission: RE | Payer: Self-pay | Source: Ambulatory Visit

## 2012-03-30 ENCOUNTER — Telehealth (HOSPITAL_COMMUNITY): Payer: Self-pay | Admitting: *Deleted

## 2012-03-30 SURGERY — Surgical Case
Anesthesia: Regional

## 2012-03-30 NOTE — Telephone Encounter (Signed)
Resolve episode 

## 2012-04-03 ENCOUNTER — Ambulatory Visit: Payer: Self-pay | Admitting: Cardiology

## 2012-04-09 ENCOUNTER — Telehealth: Payer: Self-pay | Admitting: Obstetrics and Gynecology

## 2012-04-09 NOTE — Telephone Encounter (Signed)
Lm on vm for pt to call back.

## 2012-04-10 ENCOUNTER — Telehealth: Payer: Self-pay | Admitting: Obstetrics and Gynecology

## 2012-04-10 NOTE — Telephone Encounter (Signed)
TC from patient--repeat C/S on 03/30/12.  Hx anxiety.  Reports had HA 2 days ago, called 911.  1st BP was "high" (unsure of number), but BP taken by another EMT just after that with a different machine was 120/80.  Has had dull HA occasionally since--took Ibuprophen once yesterday with benefit. Sleeping poorly, not drinking fluids.  No swelling, visual sx, or epigastric pain. No hx elevated BP with previous or current pregnancy--baseline 120s/70-80.  Breastfeeding.  Issues and concerns reviewed. Recommended patient take Ibuprophen 600 mg po q 6 hours x 24 hours, push po fluids. Call me tomorrow with update on status. Offered evaluation in MAU at any point.  Patient elects to implement plan as above and f/u prn.

## 2012-04-12 ENCOUNTER — Telehealth (HOSPITAL_COMMUNITY): Payer: Self-pay | Admitting: Obstetrics and Gynecology

## 2012-04-12 NOTE — Telephone Encounter (Signed)
TC from patient--s/p C/S 03/25/12 for repeat (previous C/S x 2). Calling with small amount brown/red drainage from incision. Wants to be seen tomorrow in office and wants BP check, too.  Will have patient come to office after she takes baby to peds office in am. Will have nurse see patient,evaluate incision, and check BP. I will see the patient briefly after that for documentation.

## 2012-04-13 ENCOUNTER — Ambulatory Visit (INDEPENDENT_AMBULATORY_CARE_PROVIDER_SITE_OTHER): Payer: Medicaid Other | Admitting: Obstetrics and Gynecology

## 2012-04-13 DIAGNOSIS — F419 Anxiety disorder, unspecified: Secondary | ICD-10-CM

## 2012-04-13 DIAGNOSIS — E079 Disorder of thyroid, unspecified: Secondary | ICD-10-CM

## 2012-04-13 DIAGNOSIS — F411 Generalized anxiety disorder: Secondary | ICD-10-CM

## 2012-04-14 DIAGNOSIS — F419 Anxiety disorder, unspecified: Secondary | ICD-10-CM | POA: Insufficient documentation

## 2012-04-14 LAB — THYROID PANEL WITH TSH
T4, Total: 10.8 ug/dL (ref 5.0–12.5)
TSH: 0.191 u[IU]/mL — ABNORMAL LOW (ref 0.350–4.500)

## 2012-04-14 NOTE — Progress Notes (Signed)
Hx repeat C/S (3rd) on 03/25/12.  Called me on Sunday night reporting bleeding from incision and incidence of bad HA x 1 last week. No further HA, visual sx, or any other symptom.  Patient was very anxious about both these issues, so I asked her to come to the office on Monday for me to evaluate.  PE: BP 110/70, other VSS Incision--small area of overlapped raw skin edges along right side of incision, with friability of the area upon examination.  Patient reports she has been washing the incisional area daily. No disruption of incisional integrity noted--no separation of suture line.  No cellulitis.  Incisional area cleaned with saline and hydrogen peroxide. AgNO3 applied to friable skin edges. Patient advised to allow soapy water to run over the incision, but do not scrub the area. Reminded her the AgNO3 will give a dark appearance to the area treated. To call with any concerns or questions.  Scheduled appt in 1 week for re-check with Dr. Normand Sloop (did C/S).

## 2012-04-16 ENCOUNTER — Telehealth: Payer: Self-pay | Admitting: Obstetrics and Gynecology

## 2012-04-16 ENCOUNTER — Telehealth: Payer: Self-pay

## 2012-04-16 NOTE — Telephone Encounter (Signed)
LM for pt to c/b regarding thyroid information. Blood test results faxed to [facility] today.

## 2012-04-16 NOTE — Telephone Encounter (Signed)
Spoke with pt. Informed pt TSH is low and per VL f/u with Dr. Olga Millers or primary MD to adjust Synthroid now that she is pp. Pt requested Dr. Ludwig Clarks contact info given and blood test results faxed today. Pt agrees and voices understanding.

## 2012-04-16 NOTE — Telephone Encounter (Signed)
Lm on vm to cb per test results.  

## 2012-04-16 NOTE — Telephone Encounter (Signed)
Message copied by Raylene Everts on Thu Apr 16, 2012 10:29 AM ------      Message from: Cornelius Moras      Created: Tue Apr 14, 2012  6:44 PM      Regarding: TSH pp       Patient had thyroid panel 04/03/12--TSH low, currently on Synthroid. Needs to consult with primary MD regarding changing Synthroid dose now that she is pp.            Thanks!      VL

## 2012-04-16 NOTE — Telephone Encounter (Signed)
LM with pt's husband for pt to c/b regarding her message.

## 2012-04-17 NOTE — Telephone Encounter (Signed)
Spoke with pt wants to be seen today. Pt states her husband is a Naval architect and won't be able to bring her to the appt on Jan 6th with ND. Informed pt VL can see her today. Pt states she prefers to see a MD. Informed pt there aren't any MD's in the office today. Pt states she will keep her appt for Jan 6th with ND.

## 2012-04-17 NOTE — Telephone Encounter (Signed)
LM for pt to cb 

## 2012-04-20 ENCOUNTER — Ambulatory Visit (INDEPENDENT_AMBULATORY_CARE_PROVIDER_SITE_OTHER): Payer: Medicaid Other | Admitting: Obstetrics and Gynecology

## 2012-04-20 ENCOUNTER — Encounter: Payer: Medicaid Other | Admitting: Obstetrics and Gynecology

## 2012-04-20 ENCOUNTER — Encounter: Payer: Self-pay | Admitting: Obstetrics and Gynecology

## 2012-04-20 VITALS — BP 142/70 | Wt 131.0 lb

## 2012-04-20 DIAGNOSIS — R300 Dysuria: Secondary | ICD-10-CM

## 2012-04-20 LAB — POCT URINALYSIS DIPSTICK
Bilirubin, UA: NEGATIVE
Glucose, UA: NEGATIVE
Nitrite, UA: NEGATIVE
pH, UA: 6

## 2012-04-20 NOTE — Progress Notes (Signed)
Incision check, pt c/o burning with urination.  She also c/o headaches.  No blurred vision.  No RUQ pain.  Pt had migraines before pregnancy and wants to see a neurologist BP 142/70  Wt 131 lb (59.421 kg)  Breastfeeding? Yes Physical Examination: General appearance - alert, well appearing, and in no distress Mental status - alert, oriented to person, place, and time Chest - clear to auscultation, no wheezes, rales or rhonchi, symmetric air entry Heart - normal rate and regular rhythm Abdomen - soft, nontender, nondistended, no masses or organomegaly Incision CDI Pt desires referral to neurology for migraines Recommend motrin and percocet prn Rt 2 weeks for pp

## 2012-04-20 NOTE — Patient Instructions (Signed)
Migraine Headache A migraine headache is an intense, throbbing pain on one or both sides of your head. A migraine can last for 30 minutes to several hours. CAUSES  The exact cause of a migraine headache is not always known. However, a migraine may be caused when nerves in the brain become irritated and release chemicals that cause inflammation. This causes pain. SYMPTOMS  Pain on one or both sides of your head.  Pulsating or throbbing pain.  Severe pain that prevents daily activities.  Pain that is aggravated by any physical activity.  Nausea, vomiting, or both.  Dizziness.  Pain with exposure to bright lights, loud noises, or activity.  General sensitivity to bright lights, loud noises, or smells. Before you get a migraine, you may get warning signs that a migraine is coming (aura). An aura may include:  Seeing flashing lights.  Seeing bright spots, halos, or zig-zag lines.  Having tunnel vision or blurred vision.  Having feelings of numbness or tingling.  Having trouble talking.  Having muscle weakness. MIGRAINE TRIGGERS  Alcohol.  Smoking.  Stress.  Menstruation.  Aged cheeses.  Foods or drinks that contain nitrates, glutamate, aspartame, or tyramine.  Lack of sleep.  Chocolate.  Caffeine.  Hunger.  Physical exertion.  Fatigue.  Medicines used to treat chest pain (nitroglycerine), birth control pills, estrogen, and some blood pressure medicines. DIAGNOSIS  A migraine headache is often diagnosed based on:  Symptoms.  Physical examination.  A CT scan or MRI of your head. TREATMENT Medicines may be given for pain and nausea. Medicines can also be given to help prevent recurrent migraines.  HOME CARE INSTRUCTIONS  Only take over-the-counter or prescription medicines for pain or discomfort as directed by your caregiver. The use of long-term narcotics is not recommended.  Lie down in a dark, quiet room when you have a migraine.  Keep a journal  to find out what may trigger your migraine headaches. For example, write down:  What you eat and drink.  How much sleep you get.  Any change to your diet or medicines.  Limit alcohol consumption.  Quit smoking if you smoke.  Get 7 to 9 hours of sleep, or as recommended by your caregiver.  Limit stress.  Keep lights dim if bright lights bother you and make your migraines worse. SEEK IMMEDIATE MEDICAL CARE IF:   Your migraine becomes severe.  You have a fever.  You have a stiff neck.  You have vision loss.  You have muscular weakness or loss of muscle control.  You start losing your balance or have trouble walking.  You feel faint or pass out.  You have severe symptoms that are different from your first symptoms. MAKE SURE YOU:   Understand these instructions.  Will watch your condition.  Will get help right away if you are not doing well or get worse. Document Released: 04/01/2005 Document Revised: 06/24/2011 Document Reviewed: 03/22/2011 Exit[employer] Patient Information 2013 [employer].  

## 2012-04-22 ENCOUNTER — Telehealth: Payer: Self-pay | Admitting: Obstetrics and Gynecology

## 2012-04-22 NOTE — Telephone Encounter (Signed)
Spoke with pt c/o of HA's denies visual disturbances, swelling, and dizziness no relief with Tylenol nor IB. Pt states she hasn't been taking the Percocet because she forgot she had it. Informed pt to take the Percocet every 6 hrs prn as indicated by ND during her last visit . Pt agrees and voices understanding.

## 2012-05-06 ENCOUNTER — Ambulatory Visit: Payer: Medicaid Other | Admitting: Obstetrics and Gynecology

## 2012-05-06 ENCOUNTER — Encounter: Payer: Self-pay | Admitting: Obstetrics and Gynecology

## 2012-05-06 DIAGNOSIS — N63 Unspecified lump in unspecified breast: Secondary | ICD-10-CM

## 2012-05-06 LAB — THYROID PANEL WITH TSH: Free Thyroxine Index: 4.7 — ABNORMAL HIGH (ref 1.0–3.9)

## 2012-05-06 NOTE — Progress Notes (Signed)
Valynn Geriann Lafont  Is 6 weeks postpartum following a C-Section at 38gestational weeks Date: 03/25/2012 female baby named Rahma delivered by Dr Normand Sloop   Breastfeeding: yes Bottlefeeding:  no  Post-partum blues / depression:  yes  EPDS score: 20. Feeling overwhelmed but not depressed. Very poor support. Denies homicidal / suicidal ideas History of abnormal Pap:  yes  Last Pap: Date  09/2011 was ASCUS with HPV - Gestational diabetes:  no  Contraception:  condoms  Normal urinary function:  yes Normal GI function:  yes Returning to work:  no  Subjective:     Erionna Eurydice Calixto is a 37 y.o. female who presents for a postpartum visit.  I have fully reviewed the prenatal and intrapartum course.    Patient is not sexually active.   The following portions of the patient's history were reviewed and updated as appropriate: allergies, current medications, past family history, past medical history, past social history, past surgical history and problem list.  Review of Systems Pertinent items are noted in HPI.   Objective:    BP 120/64  Ht 5' (1.524 m)  LMP 06/26/2011  Breastfeeding? Yes  General:  alert, cooperative and no distress     Lungs: clear to auscultation bilaterally  Heart:  regular rate and rhythm, S1, S2 normal, no murmur  Abdomen: soft, non-tender; bowel sounds normal; no masses,  no organomegaly incision normal   Vulva:  normal  Vagina: normal vagina  Cervix:  normal  Corpus: normal size, contour, position, consistency, mobility, non-tender  Adnexa:  normal adnexa  Breast Small nodule, solid at 6 o'clock below nipple Right breast   Assessment:   Normal postpartum exam.  Right breast mass Pap smear not done at today's visit.  Plan:   Worsening Migraines [facility] Right [facility]S @ [facility] Mass @ 6:00 Thyroid Panel with TSH  Follow-up 09/2012 for AEX  Silverio Lay MD 05/06/2012 12:33 PM

## 2012-05-07 NOTE — Progress Notes (Signed)
Quick Note:  Please refer to endocrinologist or ask patient to follow-up with her endocrinologist and forward results ______

## 2012-05-08 ENCOUNTER — Telehealth: Payer: Self-pay

## 2012-05-08 ENCOUNTER — Ambulatory Visit
Admission: RE | Admit: 2012-05-08 | Discharge: 2012-05-08 | Disposition: A | Discharge status: Home / Self Care | Payer: Medicaid Other | Source: Ambulatory Visit | Attending: Obstetrics and Gynecology | Admitting: Obstetrics and Gynecology

## 2012-05-08 ENCOUNTER — Other Ambulatory Visit: Payer: Medicaid Other

## 2012-05-08 DIAGNOSIS — N63 Unspecified lump in unspecified breast: Secondary | ICD-10-CM

## 2012-05-08 NOTE — Telephone Encounter (Signed)
LVM for return call regarding Labs  Darien Ramus, CMA

## 2012-05-08 NOTE — Telephone Encounter (Signed)
Message copied by Darien Ramus on Fri May 08, 2012  2:21 PM ------      Message from: Silverio Lay      Created: Thu May 07, 2012  7:14 PM       Please refer to endocrinologist or ask patient to follow-up with her endocrinologist and forward results

## 2012-05-11 NOTE — Telephone Encounter (Signed)
2nd attempt to contact pt. LVM for return call  Darien Ramus, CMA

## 2012-05-11 NOTE — Telephone Encounter (Signed)
4th attempt VM left for return call.  Darien Ramus, CMA

## 2012-05-11 NOTE — Telephone Encounter (Signed)
LVM for pt to return call.   Timya Trimmer, CMA  

## 2012-05-11 NOTE — Telephone Encounter (Signed)
Pt aware of labs. Referral form and records faxed to Dr. Willeen Cass office. Will contact pt with apt.  Darien Ramus, CMA

## 2012-06-10 ENCOUNTER — Inpatient Hospital Stay (HOSPITAL_COMMUNITY)
Admission: AD | Admit: 2012-06-10 | Discharge: 2012-06-10 | Disposition: A | Discharge status: Home / Self Care | Payer: Medicaid Other | Source: Ambulatory Visit | Attending: Obstetrics and Gynecology | Admitting: Obstetrics and Gynecology

## 2012-06-10 ENCOUNTER — Encounter (HOSPITAL_COMMUNITY): Payer: Self-pay | Admitting: *Deleted

## 2012-06-10 DIAGNOSIS — E039 Hypothyroidism, unspecified: Secondary | ICD-10-CM | POA: Insufficient documentation

## 2012-06-10 DIAGNOSIS — O99893 Other specified diseases and conditions complicating puerperium: Secondary | ICD-10-CM | POA: Insufficient documentation

## 2012-06-10 DIAGNOSIS — E079 Disorder of thyroid, unspecified: Secondary | ICD-10-CM | POA: Insufficient documentation

## 2012-06-10 DIAGNOSIS — M549 Dorsalgia, unspecified: Secondary | ICD-10-CM | POA: Insufficient documentation

## 2012-06-10 DIAGNOSIS — R51 Headache: Secondary | ICD-10-CM | POA: Insufficient documentation

## 2012-06-10 LAB — URINALYSIS, ROUTINE W REFLEX MICROSCOPIC
Bilirubin Urine: NEGATIVE
Glucose, UA: NEGATIVE mg/dL
Specific Gravity, Urine: <1.005 — ABNORMAL LOW (ref 1.005–1.030)
Urobilinogen, UA: 0.2 mg/dL (ref 0.0–1.0)

## 2012-06-10 LAB — URINE MICROSCOPIC-ADD ON

## 2012-06-10 MED ORDER — MAGNESIUM 250 MG PO TABS: 1.0000 | ORAL_TABLET | Freq: Two times a day (BID) | ORAL | Status: DC | PRN

## 2012-06-10 MED ORDER — SERTRALINE HCL 25 MG PO TABS: 25.0000 mg | ORAL_TABLET | Freq: Every day | ORAL | Status: DC

## 2012-06-10 MED ORDER — BUTALBITAL-APAP-CAFFEINE 50-325-40 MG PO TABS
2.0000 | ORAL_TABLET | Freq: Four times a day (QID) | ORAL | Status: DC | PRN
  Filled 2012-06-10: qty 2

## 2012-06-10 MED ORDER — BUTALBITAL-APAP-CAFFEINE 50-325-40 MG PO TABS: 1.0000 | ORAL_TABLET | Freq: Four times a day (QID) | ORAL | Status: DC | PRN

## 2012-06-10 NOTE — MAU Note (Addendum)
Migraines 2 years ago, now has "come back since my delivery." States she has a HA about once a week.  States hearing is like "when on an airplane" for the past few days. Back pain since before pregnancy and worse since delivery. States has spoken to V. Emilee Hero, CNM a couple of times and was advised to take ibuprofen and gets some relief with that. States she is hypothyroid and takes medication. States sometimes her heart beats fast and she has a little dizziness. States she is supposed to have level checked and would like that done today.

## 2012-06-10 NOTE — MAU Provider Note (Signed)
History     CSN: [id number]  Arrival date and time: 06/10/12 1104   None     No chief complaint on file.  HPI Comments: S: Pt is a G3 P3002 at about 12 wks pp, s/p c/s w normal visit by Dr Estanislado Pandy 1/22, except noted breast mass which and was seen by breast center.   A: She is currently breastfeeding and has 38yo at home, she admit to not not sleeping, due to baby up at night and caring and for 65 yo during day. Husband is away at work and getting ready to leave for a week to [place] (hes a truck Hospital doctor.  SHe mentioned hx of migraines, but has not received tx in quite some time.  She takes synthroid, and she called she states she called hr "Dr [place]" that lowered the dose, tho see is unsure of this dose.    A: I recommend she see Dr Lisabeth Devoid here in [place]  for referral to be sure she is on the right medication and to follow her labs.   I also gave her the number to see [facility] for her headaches, as motrin was not helping. I gave rx for firorcet and rx for zoloft, has patient seems to have ongoing significant anxiety. With various unrelated complants, she was requesting a head CT because of the headaches, she was requesting addicitional thyroid panel, even tho I explained they were drawn 1 moth ago.   P: she declined firorcet PO and states she didn't understand why it would help if it had tylenol in it. SHe did agree to take magnesium 250mg  BID   She was rx'd fiorcet 25mg  daily.   SHe is to follow up with a primary care provider for further management   Pt dc'd home in stable condition per ambulation   Headache had somewhat resolved     Past Medical History  Diagnosis Date  . Hypothyroidism 2009    DR JAEJALI  . Depression     AFTER LOSS OF BABY;  NO MEDS  . Headache, migraine     IMPROVED  . Pregnant   . Hyperlipidemia     Past Surgical History  Procedure Laterality Date  . Cesarean section    . Cesarean section  03/25/2012    Procedure:  CESAREAN SECTION;  Surgeon: Michael Litter, MD;  Location: [facility];  Service: Obstetrics;  Laterality: N/A;  Repeat Cesarean Section Delivery Girl @ 0158, Apgars 7/9    Family History  Problem Relation Age of Onset  . Kidney disease Father     STONES  . Diabetes Maternal Uncle   . Congenital heart disease Daughter     died 82 days of age  . Birth defects Daughter     HEART DEFECT  . Hypertension Mother   . Arthritis Mother   . Heart disease Mother   . Hypothyroidism Mother   . Hypothyroidism Sister   . Birth defects Cousin     HYDOCEPHALIC    History  Substance Use Topics  . Smoking status: Never Smoker   . Smokeless tobacco: Never Used  . Alcohol Use: No    Allergies: No Known Allergies  Prescriptions prior to admission  Medication Sig Dispense Refill  . levothyroxine (SYNTHROID, LEVOTHROID) 75 MCG tablet Take 75 mcg by mouth daily.      . Olopatadine HCl (PATADAY OP) Apply 1 drop to eye as needed (itchy, watery eyes due to allergies).      . Prenatal  Vit-Fe Fumarate-FA (PRENATAL MULTIVITAMIN) TABS Take 1 tablet by mouth daily.      . [DISCONTINUED] ibuprofen (ADVIL,MOTRIN) 600 MG tablet Take 1 tablet (600 mg total) by mouth every 6 (six) hours.  30 tablet  1  . [DISCONTINUED] ibuprofen (ADVIL,MOTRIN) 600 MG tablet Take 600 mg by mouth daily as needed for pain.        Review of Systems  Eyes: Negative for blurred vision.  Cardiovascular: Negative for chest pain.  Gastrointestinal: Negative for heartburn, nausea and abdominal pain.  Genitourinary: Negative for dysuria.  Neurological: Positive for headaches.  Psychiatric/Behavioral: The patient is nervous/anxious.   All other systems reviewed and are negative.   Physical Exam   Blood pressure 130/80, pulse 77, temperature 98.6 F (37 C), temperature source Oral, resp. rate 18, currently breastfeeding.  Physical Exam  Nursing note and vitals reviewed. Constitutional: She is oriented to person, place, and time.  She appears well-developed and well-nourished. No distress.  Cardiovascular: Normal rate.   Respiratory: Effort normal.  GI: Soft.  Genitourinary:  deferred  Musculoskeletal: Normal range of motion.  Neurological: She is alert and oriented to person, place, and time. She has normal reflexes. She displays normal reflexes.  Skin: Skin is warm and dry.  Psychiatric: She has a normal mood and affect. Her behavior is normal.    MAU Course  Procedures    Assessment and Plan Magnesium PO firorcet RX zoloft RX F/u primary care F/u Dr Marilynne Drivers for thyroid mgmnt     [facility]    06/10/2012, 1:14 PM

## 2012-06-11 ENCOUNTER — Telehealth: Payer: Self-pay | Admitting: Obstetrics and Gynecology

## 2012-06-11 NOTE — Telephone Encounter (Signed)
Message copied by Mason Jim on Thu Jun 11, 2012  9:26 AM ------      Message from: Malissa Hippo.      Created: Wed Jun 10, 2012 12:27 PM       P saw [facility], a few days ago and had thyroid panel drawn, she needs to follow up w Dr Talmage Nap, please schedule appointment ASAP            She also needs to see [facility] which dr Estanislado Pandy has requested.             Unless she has pregnancy issue related issues she needs to be told to see her primary care physician.             I am seeing her today in MAU            Thanks      SL        ------

## 2012-06-11 NOTE — Telephone Encounter (Signed)
TC to pt.  LM to return call. Regarding referrals.

## 2012-06-11 NOTE — Telephone Encounter (Signed)
TC from pt. Explained referrals. Pt to call PCP for [place] [facility] approval. Questioning if had any sugar in urine. Informed was neg but any concerns should be evaluated by PCP. Pt verbalizes comprehension.

## 2012-06-11 NOTE — Telephone Encounter (Signed)
Message copied by Mason Jim on Thu Jun 11, 2012  9:37 AM ------      Message from: Malissa Hippo.      Created: Wed Jun 10, 2012 12:27 PM       P saw [facility], a few days ago and had thyroid panel drawn, she needs to follow up w Dr Talmage Nap, please schedule appointment ASAP            She also needs to see [facility] which dr Estanislado Pandy has requested.             Unless she has pregnancy issue related issues she needs to be told to see her primary care physician.             I am seeing her today in MAU            Thanks      SL        ------

## 2012-07-21 ENCOUNTER — Encounter (HOSPITAL_COMMUNITY): Payer: Self-pay | Admitting: *Deleted

## 2012-07-21 ENCOUNTER — Emergency Department (HOSPITAL_COMMUNITY)
Admission: EM | Admit: 2012-07-21 | Discharge: 2012-07-21 | Disposition: A | Discharge status: Home / Self Care | Payer: Medicaid Other | Attending: Emergency Medicine | Admitting: Emergency Medicine

## 2012-07-21 DIAGNOSIS — Z79899 Other long term (current) drug therapy: Secondary | ICD-10-CM | POA: Insufficient documentation

## 2012-07-21 DIAGNOSIS — F329 Major depressive disorder, single episode, unspecified: Secondary | ICD-10-CM | POA: Insufficient documentation

## 2012-07-21 DIAGNOSIS — H9319 Tinnitus, unspecified ear: Secondary | ICD-10-CM | POA: Insufficient documentation

## 2012-07-21 DIAGNOSIS — H9312 Tinnitus, left ear: Secondary | ICD-10-CM

## 2012-07-21 DIAGNOSIS — E039 Hypothyroidism, unspecified: Secondary | ICD-10-CM | POA: Insufficient documentation

## 2012-07-21 DIAGNOSIS — F3289 Other specified depressive episodes: Secondary | ICD-10-CM | POA: Insufficient documentation

## 2012-07-21 DIAGNOSIS — Z8679 Personal history of other diseases of the circulatory system: Secondary | ICD-10-CM | POA: Insufficient documentation

## 2012-07-21 DIAGNOSIS — E785 Hyperlipidemia, unspecified: Secondary | ICD-10-CM | POA: Insufficient documentation

## 2012-07-21 NOTE — ED Provider Notes (Signed)
History    This chart was scribed for non-physician practitioner Jaci Carrel, PA-C working with Raeford Razor, MD by Gerlean Ren, ED Scribe. This patient was seen in room TR07C/TR07C and the patient's care was started at 5:57 PM.   CSN: [id number]  Arrival date & time 07/21/12  1729   None     Chief Complaint  Patient presents with  . Otalgia     The history is provided by the patient. No language interpreter was used.  Carmen West is a 37 y.o. female who presents to the Emergency Department complaining of 3 days of ringing in bilateral ears with "pressure popping similar to when in an airplane" with only minimal associated pain.  Pt reports similar prior symptom first 6 years ago for which she has been seen one year ago with no true diagnosis.  Pt has appointment with ENT tomorrow and states she is here today because she is worried it may be a serious problem.     Past Medical History  Diagnosis Date  . Hypothyroidism 2009    DR JAEJALI  . Depression     AFTER LOSS OF BABY;  NO MEDS  . Headache, migraine     IMPROVED  . Pregnant   . Hyperlipidemia     Past Surgical History  Procedure Laterality Date  . Cesarean section    . Cesarean section  03/25/2012    Procedure: CESAREAN SECTION;  Surgeon: Michael Litter, MD;  Location: [facility];  Service: Obstetrics;  Laterality: N/A;  Repeat Cesarean Section Delivery Girl @ 0158, Apgars 7/9    Family History  Problem Relation Age of Onset  . Kidney disease Father     STONES  . Diabetes Maternal Uncle   . Congenital heart disease Daughter     died 64 days of age  . Birth defects Daughter     HEART DEFECT  . Hypertension Mother   . Arthritis Mother   . Heart disease Mother   . Hypothyroidism Mother   . Hypothyroidism Sister   . Birth defects Cousin     HYDOCEPHALIC    History  Substance Use Topics  . Smoking status: Never Smoker   . Smokeless tobacco: Never Used  . Alcohol Use: No    OB History   Grav Para Term  Preterm Abortions TAB SAB Ect Mult Living   3 3 3       2       Review of Systems See HPI. Allergies  Review of patient's allergies indicates no known allergies.  Home Medications   Current Outpatient Rx  Name  Route  Sig  Dispense  Refill  . butalbital-acetaminophen-caffeine (FIORICET, ESGIC) 50-325-40 MG per tablet   Oral   Take 1-2 tablets by mouth every 6 (six) hours as needed for headache (headache).   30 tablet   0   . levothyroxine (SYNTHROID, LEVOTHROID) 75 MCG tablet   Oral   Take 75 mcg by mouth daily.         . Magnesium 250 MG TABS   Oral   Take 1 tablet (250 mg total) by mouth 2 (two) times daily as needed.   60 each   0   . Olopatadine HCl (PATADAY OP)   Ophthalmic   Apply 1 drop to eye as needed (itchy, watery eyes due to allergies).         . Prenatal Vit-Fe Fumarate-FA (PRENATAL MULTIVITAMIN) TABS   Oral   Take 1 tablet by mouth daily.         Marland Kitchen  sertraline (ZOLOFT) 25 MG tablet   Oral   Take 1 tablet (25 mg total) by mouth daily.   30 tablet   1     BP 133/73  Pulse 98  Temp(Src) 98.9 F (37.2 C) (Oral)  Resp 18  SpO2 96%  Physical Exam  Nursing note and vitals reviewed. Constitutional: She is oriented to person, place, and time. She appears well-developed and well-nourished. No distress.  HENT:  Head: Normocephalic and atraumatic.  No nasal congestion & rhinorrhea. Normal L & R external ear canals and TMs. No tragal or mastoid tenderness. Hearing equal bilaterally.  Oropharynx moist, without tonsillar exudate.   Eyes:  No pain w EOM. Conjunctiva normal   Neck: Normal range of motion.  Soft, no nuchal rigidity. No lymphadenopathy  Cardiovascular:  RRR, no aberrancy on auscultation  Pulmonary/Chest: Effort normal.  LCAB, no respiratory distress  Musculoskeletal: Normal range of motion.  Neurological: She is alert and oriented to person, place, and time.  Skin: Skin is warm and dry. She is not diaphoretic.  No rash   Psychiatric: She has a normal mood and affect. Her behavior is normal.    ED Course  Procedures (including critical care time) DIAGNOSTIC STUDIES: Oxygen Saturation is 96% on room air, adequate by my interpretation.    COORDINATION OF CARE: 6:01 PM- Informed pt that bilateral ears appear normal and that follow-up with ENT tomorrow is best course of action.  Pt understands and agrees with plan.     No diagnosis found.    MDM  Tinnitus  Pt appears non-toxic, VS normal.  Pt presents with popping and ringing in bilateral ears onset 3 days ago.  Prior episodes of same first beginning 6 years ago.  Pt hearing evaluated at the hearing clinic with no hearing abnormalities and advised follow-up with ENT. Pt has appointment with ENT tomorrow, but presents today concerned for life-threatening condition.  Informed pt that there is no treatment to be given here, and follow-up with ENT is best course of action.  Pt verbalizes understanding.      I personally performed the services described in this documentation, which was scribed in my presence. The recorded information has been reviewed and is accurate.      Jaci Carrel, [place] 07/21/12 1825

## 2012-07-21 NOTE — ED Notes (Signed)
PT to ED c/o R ear pain and some R sided throat pain 3 days prior.  Denies fevers.

## 2012-07-28 NOTE — ED Provider Notes (Signed)
Medical screening examination/treatment/procedure(s) were performed by non-physician practitioner and as supervising physician I was immediately available for consultation/collaboration.  Shaquavia Whisonant, MD 07/28/12 0810 

## 2012-09-16 ENCOUNTER — Encounter: Payer: Self-pay | Admitting: Neurology

## 2012-09-16 ENCOUNTER — Ambulatory Visit (INDEPENDENT_AMBULATORY_CARE_PROVIDER_SITE_OTHER): Payer: Medicaid Other | Admitting: Neurology

## 2012-09-16 VITALS — BP 123/68 | HR 60 | Ht 60.0 in | Wt 128.0 lb

## 2012-09-16 DIAGNOSIS — F411 Generalized anxiety disorder: Secondary | ICD-10-CM

## 2012-09-16 DIAGNOSIS — F419 Anxiety disorder, unspecified: Secondary | ICD-10-CM

## 2012-09-16 DIAGNOSIS — R519 Headache, unspecified: Secondary | ICD-10-CM | POA: Insufficient documentation

## 2012-09-16 DIAGNOSIS — R51 Headache: Secondary | ICD-10-CM | POA: Insufficient documentation

## 2012-09-16 MED ORDER — SUMATRIPTAN SUCCINATE 100 MG PO TABS: 100.0000 mg | ORAL_TABLET | ORAL | Status: DC | PRN

## 2012-09-16 NOTE — Patient Instructions (Signed)
Magnesium oxide 400 mg twice a day,  Riboflavin 100 mg twice a day 

## 2012-09-16 NOTE — Progress Notes (Signed)
History of present illness:  Carmen West is a 37 years old right-handed female, accompanied by her children, referred by her primary care physician Dr. Alcide Goodness for evaluation of headaches  She had a past medical history of migraine, she lost her child of 18 days' old  in April 2012, she started to have more frequent migraine since then, she was able to pregnant again, had a normal delivery in December 2013, her headache was under good control during her pregnancy,   However since her delivery, she began having more frequent headaches, about once a week, she also has trouble sleeping, her newborn daughter kept her awake, she is still breast-feeding, she took ibuprofen 600 mg once or twice each week, trigger for migraine headaches as sleep deprivation, stress, hungry,  Her typical migraine are severe pounding headaches involving bilateral occipital, holocranial, with associated light noise sensitivity, nauseous, lasting couple hours to half day, movements make it worse,  She has never tried triptan treatment in the past  Review of Systems  Out of a complete 14 system review, the patient complains of only the following symptoms, and all other reviewed systems are negative.   Constitutional:   Fatigue Cardiovascular:  N/A Ear/Nose/Throat:  N/A Skin: N/A Eyes: N/A Respiratory: N/A Gastroitestinal: N/A    Hematology/Lymphatic:  N/A Endocrine:  N/A Musculoskeletal:N/A Allergy/Immunology: N/A Neurological: headaches, difficulty sleeping, Psychiatric:    N/A  PHYSICAL EXAMINATOINS:  Generalized: In no acute distress  Neck: Supple, no carotid bruits   Cardiac: Regular rate rhythm  Pulmonary: Clear to auscultation bilaterally  Musculoskeletal: No deformity  Neurological examination  Mentation: Alert oriented to time, place, history taking, and causual conversation  Cranial nerve II-XII: Pupils were equal round reactive to light extraocular movements were full, visual field were full on  confrontational test. facial sensation and strength were normal. hearing was intact to finger rubbing bilaterally. Uvula tongue midline.  head turning and shoulder shrug and were normal and symmetric.Tongue protrusion into cheek strength was normal.  Motor: normal tone, bulk and strength.  Sensory: Intact to fine touch, pinprick, preserved vibratory sensation, and proprioception at toes.  Coordination: Normal finger to nose, heel-to-shin bilaterally there was no truncal ataxia  Gait: Rising up from seated position without assistance, normal stance, without trunk ataxia, moderate stride, good arm swing, smooth turning, able to perform tiptoe, and heel walking without difficulty.   Romberg signs: Negative  Deep tendon reflexes: Brachioradialis 2/2, biceps 2/2, triceps 2/2, patellar 2/2, Achilles 2/2, plantar responses were flexor bilaterally.   Assessment and plan:  37 years old right-handed female, with history of migraine headaches, now presenting with increased frequency of migraine, normal neurological examination,  1 she desires imaging study to rule out structural lesion, MRI of the brain 2, will try over-the-counter magnesium oxide, riboflavin twice a day   3. Imitrex as needed 4 return to clinic in 3 months with Eber Jones

## 2012-09-24 ENCOUNTER — Ambulatory Visit
Admission: RE | Admit: 2012-09-24 | Discharge: 2012-09-24 | Disposition: A | Discharge status: Home / Self Care | Payer: Medicaid Other | Source: Ambulatory Visit | Attending: Neurology | Admitting: Neurology

## 2012-09-24 DIAGNOSIS — R51 Headache: Secondary | ICD-10-CM

## 2012-09-24 DIAGNOSIS — F419 Anxiety disorder, unspecified: Secondary | ICD-10-CM

## 2012-09-25 ENCOUNTER — Ambulatory Visit
Admission: RE | Admit: 2012-09-25 | Discharge: 2012-09-25 | Disposition: A | Discharge status: Home / Self Care | Payer: Medicaid Other | Source: Ambulatory Visit | Attending: Neurology | Admitting: Neurology

## 2012-09-25 DIAGNOSIS — R51 Headache: Secondary | ICD-10-CM

## 2012-09-26 ENCOUNTER — Inpatient Hospital Stay: Admission: RE | Admit: 2012-09-26 | Payer: Medicaid Other | Source: Ambulatory Visit

## 2012-09-28 ENCOUNTER — Telehealth: Payer: Self-pay | Admitting: Neurology

## 2012-09-28 NOTE — Telephone Encounter (Signed)
Calling to get the results of her MRI.  Patient would like a call today before 12:00 noon because she is flying out of town.

## 2012-09-29 NOTE — Telephone Encounter (Signed)
Please call her normal MRI brain

## 2012-09-30 ENCOUNTER — Telehealth: Payer: Self-pay | Admitting: *Deleted

## 2012-09-30 NOTE — Telephone Encounter (Signed)
Left a message on the pt's home voice mail regarding her recent MRI being within normal limits.  Contact information was given so that she may call with any questions or concerns.

## 2012-09-30 NOTE — Telephone Encounter (Signed)
Left a message on the pt's mobile voice mail regarding her recent MRI being within normal limits.  Contact information was given so that she may call with any questions or concerns.

## 2012-09-30 NOTE — Telephone Encounter (Signed)
Spoke with patient's husband and relayed the results of the patient's MRI.  The patient's husband understood and had no questions however he did state that his wife has left the country and will not be returning.  I have cancelled the patient's follow-up appointment in September with Darrol Angel and have noted the patient's current status on their Snap Shot under "Specialty Comments".

## 2012-09-30 NOTE — Progress Notes (Signed)
Quick Note:  Spoke with patient's husband and relayed the results of her MRI. The patient's husband understood and had no questions.  ______

## 2012-10-29 NOTE — Progress Notes (Signed)
Quick Note:  Unable to reach patient and leave a message. ______

## 2012-11-03 NOTE — Progress Notes (Signed)
Quick Note:  Called and not available. ______

## 2012-11-04 NOTE — Progress Notes (Signed)
Quick Note:  I called and not able to reach pts cell (not working number). I called spouse's home # and LMVM normal MRI scan. They are to call if questions. Relayed that only number her Cell is not a working #. ______

## 2012-12-17 ENCOUNTER — Ambulatory Visit: Payer: Medicaid Other | Admitting: Nurse Practitioner

## 2013-12-14 IMAGING — US US OB COMP +14 WK
1 series · 12 of 28 positions shown · non-contrast
Comparison: none

[Series 1: us ob comp +14 wk · 12 of 61 slices shown]
[im 3/61]
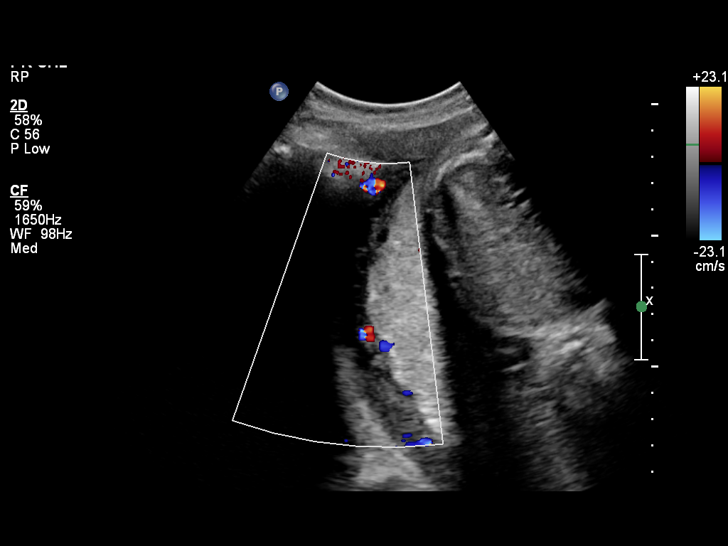
[im 7/61]
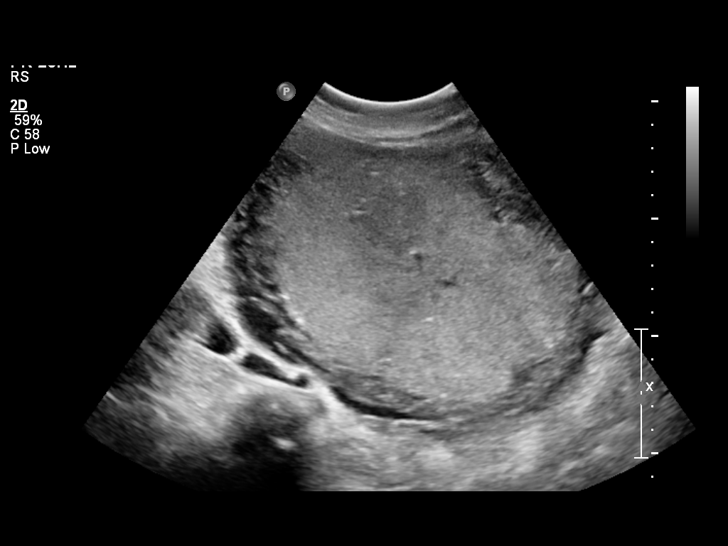
[im 12/61]
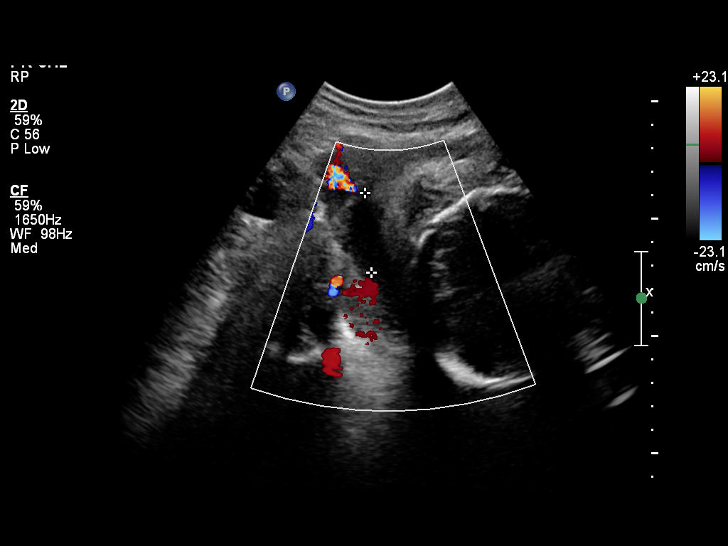
[im 18/61]
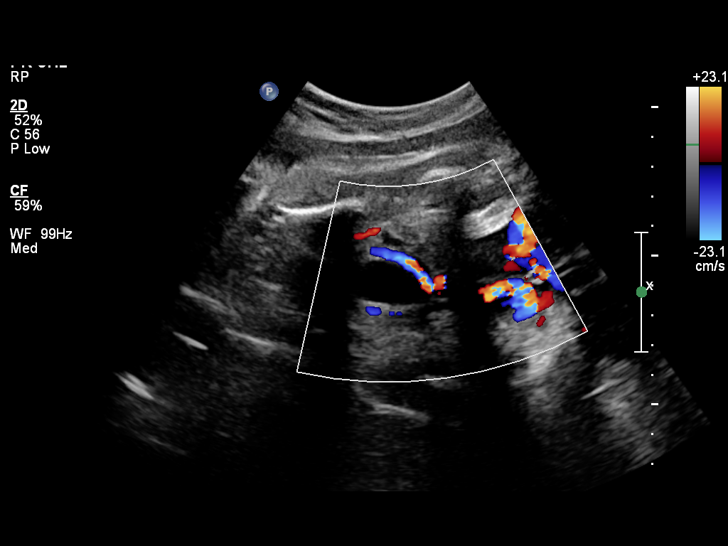
[im 23/61]
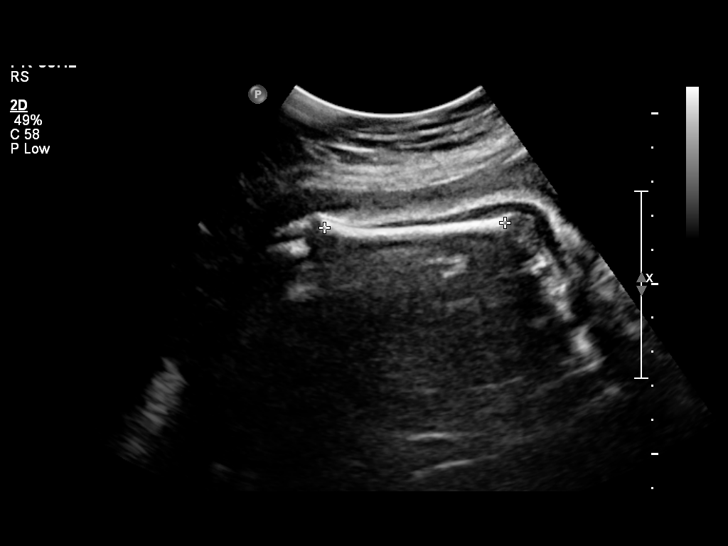
[im 27/61]
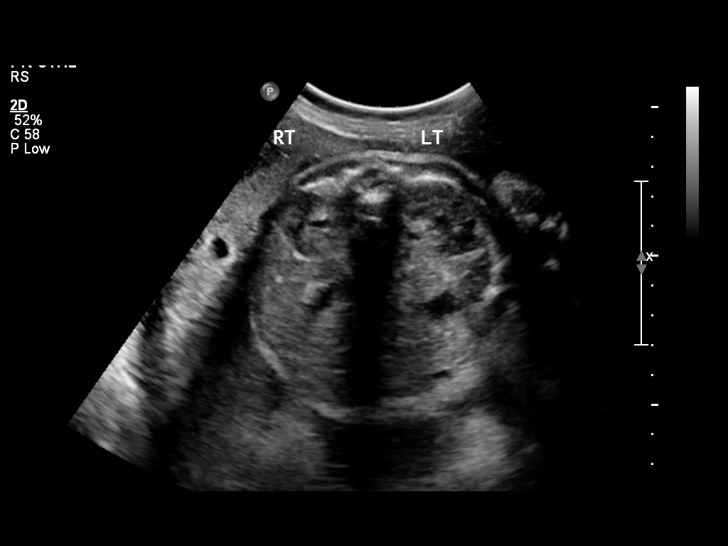
[im 34/61]
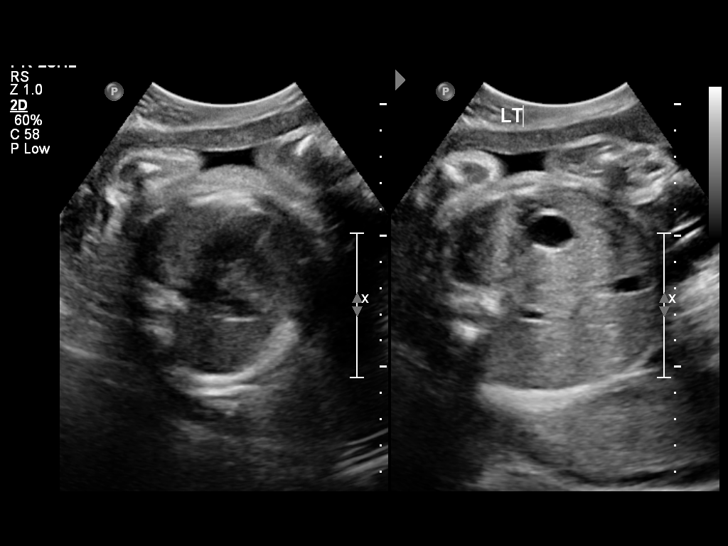
[im 38/61]
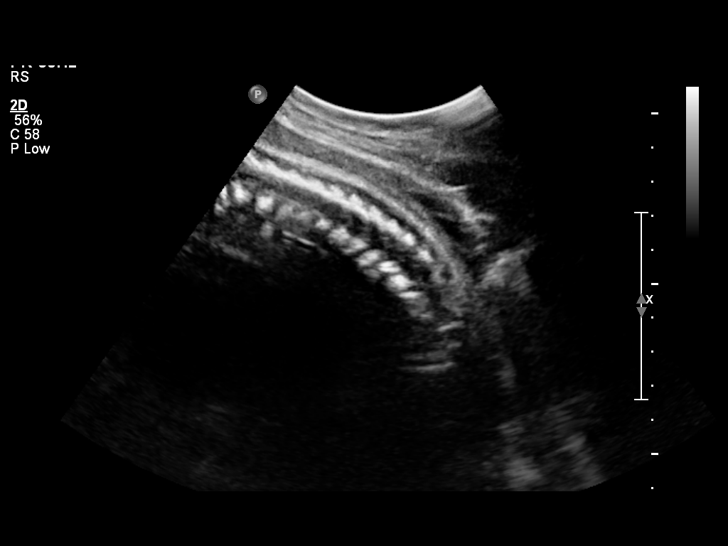
[im 43/61]
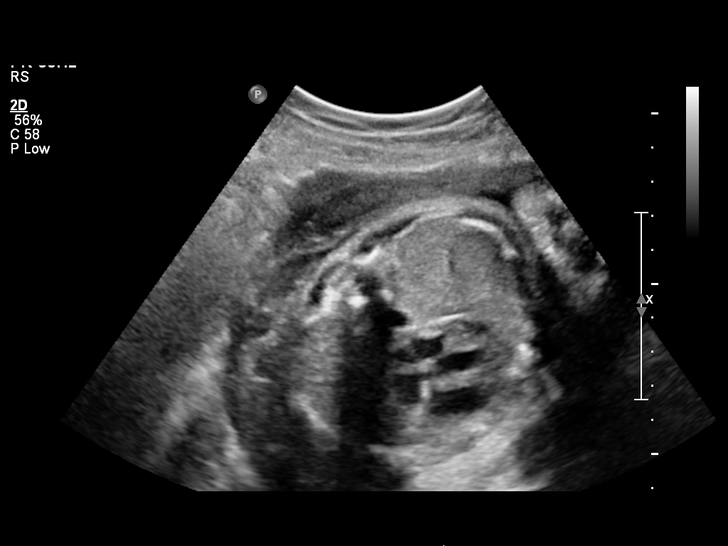
[im 49/61]
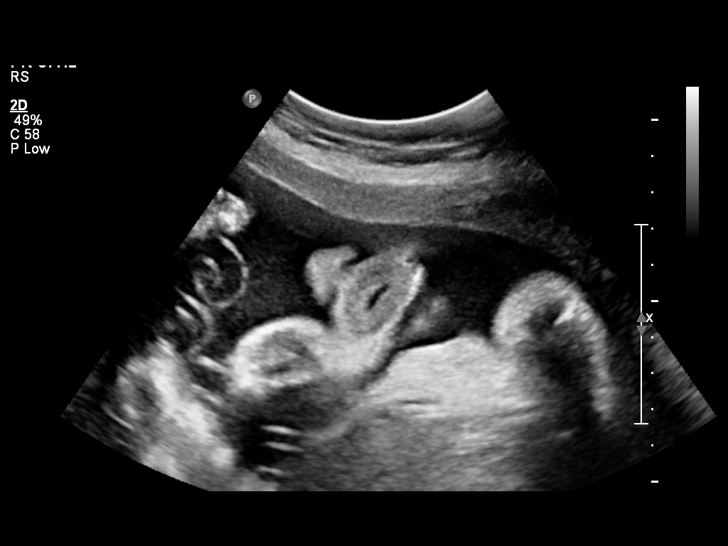
[im 54/61]
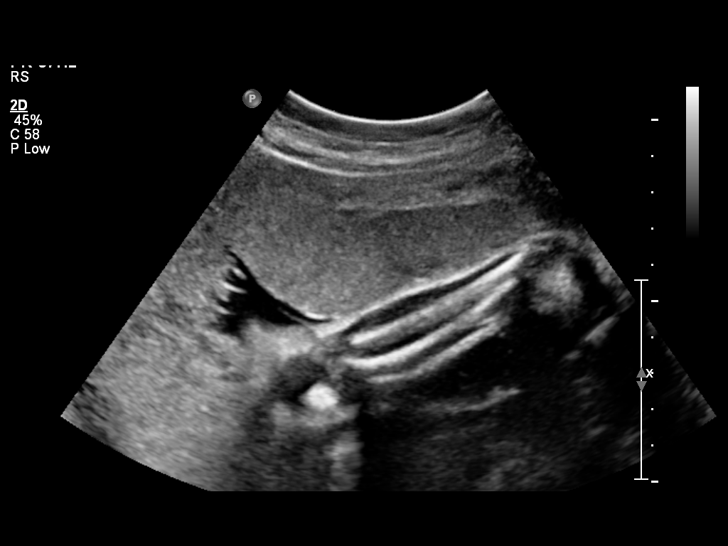
[im 58/61]
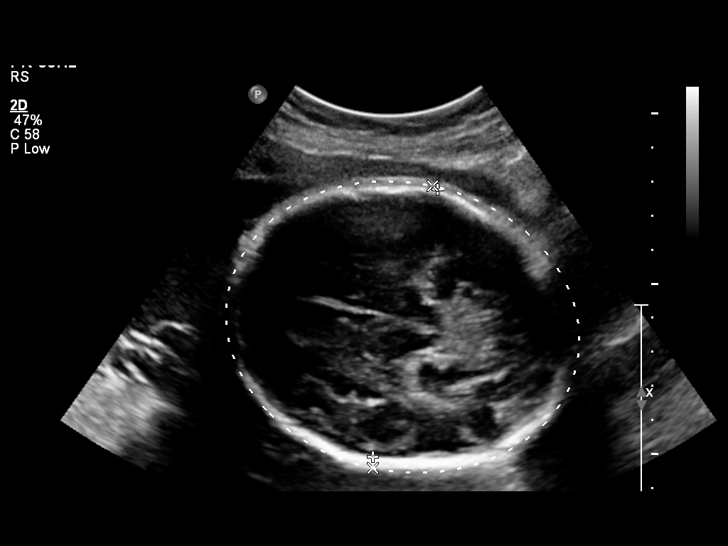

[12 of 28 positions shown; findings below may reference images not displayed]

OBSTETRICS REPORT
                      (Signed Final 01/29/2012 [DATE])

Service(s) Provided

 US OB COMP + 14 WK                                    76805.1
Indications

 Fall; abdomen/pelvic injury and pain
 Assess placenta
 Assess Fetal Growth / Estimated Fetal Weight
 C-section x 2
Fetal Evaluation

 Num Of Fetuses:    1
 Preg. Location:    Intrauterine
 Fetal Heart Rate:  143                         bpm
 Cardiac Activity:  Observed
 Presentation:      Cephalic
 Placenta:          Posterior, above cervical
                    os

 Comment:    No placental abruption or previa identified.

 Amniotic Fluid
 AFI FV:      Subjectively within normal limits
 AFI Sum:     14.59   cm      51   %Tile     Larg Pckt:   5.56   cm
 RUQ:   2.06   cm    RLQ:    3.4    cm    LUQ:   5.56    cm   LLQ:    3.57   cm
Biometry

 BPD:     80.9  mm    G. Age:   32w 3d                CI:        74.72   70 - 86
                                                      FL/HC:      19.5   19.3 -

 HC:       297  mm    G. Age:   32w 6d       61  %    HC/AC:      1.01   0.96 -

 AC:     294.8  mm    G. Age:   33w 3d       95  %    FL/BPD:     71.7   71 - 87
 FL:        58  mm    G. Age:   30w 2d       18  %    FL/AC:      19.7   20 - 24
 HUM:     53.4  mm    G. Age:   31w 1d       51  %

 Est. FW:    0151  gm      4 lb 5 oz     77  %
Gestational Age

 Clinical EDD:  31w 1d                                        EDD:   03/31/12
 U/S Today:     32w 2d                                        EDD:   03/23/12
 Best:          31w 1d    Det. By:   Clinical EDD             EDD:   03/31/12
Anatomy

 Cranium:          Appears normal         Aortic Arch:      Basic anatomy
                                                            exam per order
 Fetal Cavum:      Appears normal         Ductal Arch:      Basic anatomy
                                                            exam per order
 Ventricles:       Appears normal         Diaphragm:        Appears normal
 Choroid Plexus:   Not well visualized    Stomach:          Appears normal, left
                                                            sided
 Cerebellum:       Appears normal         Abdomen:          Appears normal
 Posterior Fossa:  Not well visualized    Abdominal Wall:   Appears nml (cord
                                                            insert, abd wall)
 Nuchal Fold:      Not applicable (>20    Cord Vessels:     Appears normal (3
                   wks GA)                                  vessel cord)
 Face:             Profile appears        Kidneys:          Appear normal
                   normal
 Lips:             Appears normal         Bladder:          Appears normal
 Heart:            Appears normal         Spine:            Appears normal
                   (4CH, axis, and
                   situs)
 RVOT:             Not well visualized    Lower             Appears normal
                                          Extremities:
 LVOT:             Not well visualized    Upper             Appears normal
                                          Extremities:

 Other:  Female gender. Technically difficult due to advanced GA.
Cervix Uterus Adnexa

 Cervical Length:   3.2       cm

 Cervix:       Normal appearance by transabdominal scan.
 Uterus:       No abnormality visualized.
 Left Ovary:   No adnexal mass visualized.
 Right Ovary:  No adnexal mass visualized.
 Adnexa:     No abnormality visualized.
Impression

 Assigned GA is currently 31w 1d.   Appropriate fetal growth,
 with EFW at 77 %ile.
 Suboptimal visualization of anatomy due to advanced GA,
 however no fetal anomalies seen involving visualized
 anatomy.
 Amniotic fluid within normal limits, with AFI of 14.59 cm.
 Normal amniotic fluid volume.
 No placental abruption or previa identifed.

 questions or concerns.

## 2014-02-14 ENCOUNTER — Encounter: Payer: Self-pay | Admitting: Neurology

## 2020-01-28 ENCOUNTER — Encounter (HOSPITAL_COMMUNITY): Payer: Self-pay | Admitting: Emergency Medicine

## 2020-01-28 ENCOUNTER — Emergency Department (HOSPITAL_COMMUNITY)
Admission: EM | Admit: 2020-01-28 | Discharge: 2020-01-28 | Disposition: A | Discharge status: Home / Self Care | Payer: HRSA Program | Attending: Emergency Medicine | Admitting: Emergency Medicine

## 2020-01-28 ENCOUNTER — Other Ambulatory Visit: Payer: Self-pay

## 2020-01-28 DIAGNOSIS — Z79899 Other long term (current) drug therapy: Secondary | ICD-10-CM | POA: Diagnosis not present

## 2020-01-28 DIAGNOSIS — E039 Hypothyroidism, unspecified: Secondary | ICD-10-CM | POA: Insufficient documentation

## 2020-01-28 DIAGNOSIS — R059 Cough, unspecified: Secondary | ICD-10-CM | POA: Diagnosis present

## 2020-01-28 DIAGNOSIS — U071 COVID-19: Secondary | ICD-10-CM | POA: Insufficient documentation

## 2020-01-28 LAB — CBC WITH DIFFERENTIAL/PLATELET
Abs Immature Granulocytes: 0.01 10*3/uL (ref 0.00–0.07)
Basophils Absolute: 0 10*3/uL (ref 0.0–0.1)
Basophils Relative: 0 %
Eosinophils Absolute: 0.1 10*3/uL (ref 0.0–0.5)
Eosinophils Relative: 1 %
HCT: 46.4 % — ABNORMAL HIGH (ref 36.0–46.0)
Hemoglobin: 15.2 g/dL — ABNORMAL HIGH (ref 12.0–15.0)
Immature Granulocytes: 0 %
Lymphocytes Relative: 26 %
Lymphs Abs: 1.3 10*3/uL (ref 0.7–4.0)
MCH: 30 pg (ref 26.0–34.0)
MCHC: 32.8 g/dL (ref 30.0–36.0)
MCV: 91.7 fL (ref 80.0–100.0)
Monocytes Absolute: 0.3 10*3/uL (ref 0.1–1.0)
Monocytes Relative: 6 %
Neutro Abs: 3.5 10*3/uL (ref 1.7–7.7)
Neutrophils Relative %: 67 %
Platelets: 168 10*3/uL (ref 150–400)
RBC: 5.06 MIL/uL (ref 3.87–5.11)
RDW: 12.6 % (ref 11.5–15.5)
WBC: 5.2 10*3/uL (ref 4.0–10.5)
nRBC: 0 % (ref 0.0–0.2)

## 2020-01-28 LAB — COMPREHENSIVE METABOLIC PANEL
ALT: 22 U/L (ref 0–44)
AST: 24 U/L (ref 15–41)
Albumin: 3.9 g/dL (ref 3.5–5.0)
Alkaline Phosphatase: 53 U/L (ref 38–126)
Anion gap: 9 (ref 5–15)
BUN: 7 mg/dL (ref 6–20)
CO2: 26 mmol/L (ref 22–32)
Calcium: 9.2 mg/dL (ref 8.9–10.3)
Chloride: 103 mmol/L (ref 98–111)
Creatinine, Ser: 0.57 mg/dL (ref 0.44–1.00)
GFR, Estimated: >60 mL/min (ref >60)
Glucose, Bld: 114 mg/dL — ABNORMAL HIGH (ref 70–99)
Potassium: 3.3 mmol/L — ABNORMAL LOW (ref 3.5–5.1)
Sodium: 138 mmol/L (ref 135–145)
Total Bilirubin: 0.7 mg/dL (ref 0.3–1.2)
Total Protein: 7.7 g/dL (ref 6.5–8.1)

## 2020-01-28 NOTE — ED Provider Notes (Signed)
[name] EMERGENCY DEPARTMENT Provider Note   CSN: [id number] Arrival date & time: 01/28/20  1152     History Chief Complaint  Patient presents with   Covid Positive    Carmen West is a 44 y.o. female.  HPI     44 year old female comes in a chief complaint of COVID-19.  Patient reports that 3 or 4 days after she received her second Pfizer vaccine she started developing cough.  She tested herself on Monday and was positive for COVID-19.  She has no shortness of breath, but reports some URI-like symptoms and mild cough.  She is having body aches and weakness.  She wants to make sure that she is not going to have complications from the fact that she received vaccination close around the time she developed COVID-19.  Patient has no significant medical history.  She has been taking Tylenol for headaches.  Past Medical History:  Diagnosis Date   Anxiety    some   Depression    AFTER LOSS OF BABY;  NO MEDS   Headache, migraine    IMPROVED   Hyperlipidemia    Hypothyroidism 2009   DR [facility]   Pregnant     Patient Active Problem List   Diagnosis Date Noted   Headache(784.0) 09/16/2012   Anxiety 04/14/2012   Palpitations 02/10/2012   Flu 02/05/2012   Tachycardia 01/29/2012   Family history of congenital heart defect 08/22/2011    Past Surgical History:  Procedure Laterality Date   CESAREAN SECTION  2013,2008,2012   lost child(2012)   CESAREAN SECTION  03/25/2012   Procedure: CESAREAN SECTION;  Surgeon: Michael Litter, MD;  Location: [facility] ORS;  Service: Obstetrics;  Laterality: N/A;  Repeat Cesarean Section Delivery Girl @ 0158, Apgars 7/9     OB History    Gravida  3   Para  3   Term  3   Preterm      AB      Living  2     SAB      TAB      Ectopic      Multiple      Live Births  3           Family History  Problem Relation Age of Onset   Kidney disease Father        STONES   Congenital heart  disease Daughter        died 61 days of age   Birth defects Daughter        HEART DEFECT   Hypertension Mother    Arthritis Mother    Heart disease Mother    Hypothyroidism Mother    Atrial fibrillation Mother    Birth defects Cousin        HYDOCEPHALIC   Diabetes Maternal Uncle    Hypothyroidism Sister     Social History   Tobacco Use   Smoking status: Never Smoker   Smokeless tobacco: Never Used  Substance Use Topics   Alcohol use: No   Drug use: No    Home Medications Prior to Admission medications   Medication Sig Start Date End Date Taking? Authorizing Provider  ibuprofen (ADVIL,MOTRIN) 600 MG tablet Take 600 mg by mouth daily as needed for pain.    [provider]  levothyroxine (SYNTHROID, LEVOTHROID) 88 MCG tablet Take 88 mcg by mouth daily.    [provider]  Prenatal Vit-Fe Fumarate-FA (PRENATAL MULTIVITAMIN) TABS Take 1 tablet by mouth daily.  [provider]  SUMAtriptan (IMITREX) 100 MG tablet Take 1 tablet (100 mg total) by mouth every 2 (two) hours as needed for migraine. 09/16/12   Levert Feinstein, MD    Allergies    Patient has no known allergies.  Review of Systems   Review of Systems  Constitutional: Positive for activity change.  HENT: Positive for congestion.   Respiratory: Negative for shortness of breath.   Musculoskeletal: Positive for myalgias.    Physical Exam Updated Vital Signs BP 133/87    Pulse 87    Temp 98.8 F (37.1 C)    Resp 15    Ht 5' (1.524 m)    Wt 65 kg    SpO2 100%    BMI 27.99 kg/m   Physical Exam Vitals and nursing note reviewed.  Constitutional:      Appearance: She is well-developed.  HENT:     Head: Normocephalic and atraumatic.  Cardiovascular:     Rate and Rhythm: Normal rate.  Pulmonary:     Effort: Pulmonary effort is normal.  Abdominal:     General: Bowel sounds are normal.  Musculoskeletal:     Cervical back: Normal range of motion and neck supple.  Skin:     General: Skin is warm and dry.  Neurological:     Mental Status: She is alert and oriented to person, place, and time.     ED Results / Procedures / Treatments   Labs (all labs ordered are listed, but only abnormal results are displayed) Labs Reviewed  COMPREHENSIVE METABOLIC PANEL - Abnormal; Notable for the following components:      Result Value   Potassium 3.3 (*)    Glucose, Bld 114 (*)    All other components within normal limits  CBC WITH DIFFERENTIAL/PLATELET - Abnormal; Notable for the following components:   Hemoglobin 15.2 (*)    HCT 46.4 (*)    All other components within normal limits    EKG None  Radiology No results found.  Procedures Procedures (including critical care time)  Medications Ordered in ED Medications - No data to display  ED Course  I have reviewed the triage vital signs and the nursing notes.  Pertinent labs & imaging results that were available during my care of the patient were reviewed by me and considered in my medical decision making (see chart for details).    MDM Rules/Calculators/A&P                          [name] was evaluated in Emergency Department on 01/28/2020 for the symptoms described in the history of present illness. She was evaluated in the context of the global COVID-19 pandemic, which necessitated consideration that the patient might be at risk for infection with the SARS-CoV-2 virus that causes COVID-19. Institutional protocols and algorithms that pertain to the evaluation of patients at risk for COVID-19 are in a state of rapid change based on information released by regulatory bodies including the CDC and federal and state organizations. These policies and algorithms were followed during the patient's care in the ED.   Pt has covid. Partially vaccinated when she likely acquired covid. She is is nad. vss and wnl. Healthy.   Reassured. Answered all questions. Not very high risk for complication. advised  aspirin x 14 days.   Final Clinical Impression(s) / ED Diagnoses Final diagnoses:  COVID-19    Rx / DC Orders ED Discharge Orders    None  Derwood Kaplan, MD 01/28/20 1732

## 2020-01-28 NOTE — ED Triage Notes (Signed)
Pt states tested pos for covid 5 days ago, has a cough that has now improved but still feeling tired and having body aches. Ambulatory to triage with nad.

## 2020-01-28 NOTE — Discharge Instructions (Addendum)
Isolate for 14 days after the symptoms started to protect others. Take aspirin 81 mg daily for 15 days.  Return to the ER if the breathing gets worse.

## 2020-02-21 ENCOUNTER — Telehealth: Payer: Self-pay

## 2020-02-21 NOTE — Telephone Encounter (Signed)
Contacted patient for updated location of the [facility] for upcoming appt. LVM for patient to contact the office.

## 2020-02-22 ENCOUNTER — Telehealth: Payer: Self-pay

## 2020-02-22 ENCOUNTER — Ambulatory Visit: Payer: Self-pay

## 2020-02-22 NOTE — Telephone Encounter (Signed)
Contacted patient by phone with updated information on location of the [facility]. LVM requesting patient call the office.

## 2020-03-08 ENCOUNTER — Telehealth: Payer: Self-pay

## 2020-03-08 NOTE — Telephone Encounter (Signed)
Pt called stating she needs an appointment with you asap

## 2020-03-16 ENCOUNTER — Encounter: Payer: Self-pay | Admitting: Physician Assistant

## 2020-03-16 ENCOUNTER — Other Ambulatory Visit: Payer: Self-pay

## 2020-03-16 ENCOUNTER — Ambulatory Visit: Payer: Self-pay | Attending: Critical Care Medicine | Admitting: Physician Assistant

## 2020-03-16 VITALS — BP 123/80 | HR 77 | Temp 98.7°F | Resp 18 | Ht 62.0 in | Wt 147.0 lb

## 2020-03-16 DIAGNOSIS — Z13220 Encounter for screening for lipoid disorders: Secondary | ICD-10-CM

## 2020-03-16 DIAGNOSIS — Z0000 Encounter for general adult medical examination without abnormal findings: Secondary | ICD-10-CM

## 2020-03-16 DIAGNOSIS — Z1159 Encounter for screening for other viral diseases: Secondary | ICD-10-CM

## 2020-03-16 DIAGNOSIS — E039 Hypothyroidism, unspecified: Secondary | ICD-10-CM

## 2020-03-16 DIAGNOSIS — Z114 Encounter for screening for human immunodeficiency virus [HIV]: Secondary | ICD-10-CM

## 2020-03-16 NOTE — Patient Instructions (Signed)
Continue thyroid medication as currently prescribed, we will call you with your lab results and I will refill your medication at that time.  Please let us know if there is anything else we can do for you  Kennieth Rad, PA-C Physician Assistant [place] [email or web address]   Preventive Care 31-45 Years Old, Female Preventive care refers to visits with your health care provider and lifestyle choices that can promote health and wellness. This includes:  A yearly physical exam. This may also be called an annual well check.  Regular dental visits and eye exams.  Immunizations.  Screening for certain conditions.  Healthy lifestyle choices, such as eating a healthy diet, getting regular exercise, not using drugs or products that contain nicotine and tobacco, and limiting alcohol use. What can I expect for my preventive care visit? Physical exam Your health care provider will check your:  Height and weight. This may be used to calculate body mass index (BMI), which tells if you are at a healthy weight.  Heart rate and blood pressure.  Skin for abnormal spots. Counseling Your health care provider may ask you questions about your:  Alcohol, tobacco, and drug use.  Emotional well-being.  Home and relationship well-being.  Sexual activity.  Eating habits.  Work and work Statistician.  Method of birth control.  Menstrual cycle.  Pregnancy history. What immunizations do I need?  Influenza (flu) vaccine  This is recommended every year. Tetanus, diphtheria, and pertussis (Tdap) vaccine  You may need a Td booster every 10 years. Varicella (chickenpox) vaccine  You may need this if you have not been vaccinated. Zoster (shingles) vaccine  You may need this after age 73. Measles, mumps, and rubella (MMR) vaccine  You may need at least one dose of MMR if you were born in 1957 or later. You may also need a second  dose. Pneumococcal conjugate (PCV13) vaccine  You may need this if you have certain conditions and were not previously vaccinated. Pneumococcal polysaccharide (PPSV23) vaccine  You may need one or two doses if you smoke cigarettes or if you have certain conditions. Meningococcal conjugate (MenACWY) vaccine  You may need this if you have certain conditions. Hepatitis A vaccine  You may need this if you have certain conditions or if you travel or work in places where you may be exposed to hepatitis A. Hepatitis B vaccine  You may need this if you have certain conditions or if you travel or work in places where you may be exposed to hepatitis B. Haemophilus influenzae type b (Hib) vaccine  You may need this if you have certain conditions. Human papillomavirus (HPV) vaccine  If recommended by your health care provider, you may need three doses over 6 months. You may receive vaccines as individual doses or as more than one vaccine together in one shot (combination vaccines). Talk with your health care provider about the risks and benefits of combination vaccines. What tests do I need? Blood tests  Lipid and cholesterol levels. These may be checked every 5 years, or more frequently if you are over 62 years old.  Hepatitis C test.  Hepatitis B test. Screening  Lung cancer screening. You may have this screening every year starting at age 58 if you have a 30-pack-year history of smoking and currently smoke or have quit within the past 15 years.  Colorectal cancer screening. All adults should have this screening starting at age 7 and continuing until age 62. Your health care provider may recommend  screening at age 61 if you are at increased risk. You will have tests every 1-10 years, depending on your results and the type of screening test.  Diabetes screening. This is done by checking your blood sugar (glucose) after you have not eaten for a while (fasting). You may have this done every  1-3 years.  Mammogram. This may be done every 1-2 years. Talk with your health care provider about when you should start having regular mammograms. This may depend on whether you have a family history of breast cancer.  BRCA-related cancer screening. This may be done if you have a family history of breast, ovarian, tubal, or peritoneal cancers.  Pelvic exam and Pap test. This may be done every 3 years starting at age 67. Starting at age 40, this may be done every 5 years if you have a Pap test in combination with an HPV test. Other tests  Sexually transmitted disease (STD) testing.  Bone density scan. This is done to screen for osteoporosis. You may have this scan if you are at high risk for osteoporosis. Follow these instructions at home: Eating and drinking  Eat a diet that includes fresh fruits and vegetables, whole grains, lean protein, and low-fat dairy.  Take vitamin and mineral supplements as recommended by your health care provider.  Do not drink alcohol if: ? Your health care provider tells you not to drink. ? You are pregnant, may be pregnant, or are planning to become pregnant.  If you drink alcohol: ? Limit how much you have to 0-1 drink a day. ? Be aware of how much alcohol is in your drink. In the [place] one drink equals one 12 oz bottle of beer (355 mL), one 5 oz glass of wine (148 mL), or one 1 oz glass of hard liquor (44 mL). Lifestyle  Take daily care of your teeth and gums.  Stay active. Exercise for at least 30 minutes on 5 or more days each week.  Do not use any products that contain nicotine or tobacco, such as cigarettes, e-cigarettes, and chewing tobacco. If you need help quitting, ask your health care provider.  If you are sexually active, practice safe sex. Use a condom or other form of birth control (contraception) in order to prevent pregnancy and STIs (sexually transmitted infections).  If told by your health care provider, take low-dose aspirin daily  starting at age 9. What's next?  Visit your health care provider once a year for a well check visit.  Ask your health care provider how often you should have your eyes and teeth checked.  Stay up to date on all vaccines. This information is not intended to replace advice given to you by your health care provider. Make sure you discuss any questions you have with your health care provider. Document Revised: 12/11/2017 Document Reviewed: 12/11/2017 Elsevier Patient Education  2020 [employer].

## 2020-03-16 NOTE — Progress Notes (Signed)
Patient verified DOB Patient has no pain at this time. Patient has taken medication today. Patient has not eaten to complete blood work.

## 2020-03-16 NOTE — Progress Notes (Signed)
New Patient Office Visit  Subjective:  Patient ID: Carmen West, female    DOB: [birth year]  Age: 44 y.o. MRN: [id number]  CC:  Chief Complaint  Patient presents with  . Hypothyroidism    HPI Carmen West reports that she has been treated for hypothyroidism for many years, states that she is compliant to her medications, recently moved to the [place] and does have a current supply of her medication.  Reports April 2021 mammogram that was within normal limits.  No other concerns at this time    Past Medical History:  Diagnosis Date  . Anxiety    some  . Depression    AFTER LOSS OF BABY;  NO MEDS  . Headache, migraine    IMPROVED  . Hyperlipidemia   . Hypothyroidism 2009   DR JAEJALI  . Pregnant     Past Surgical History:  Procedure Laterality Date  . CESAREAN SECTION  2013,2008,2012   lost child(2012)  . CESAREAN SECTION  03/25/2012   Procedure: CESAREAN SECTION;  Surgeon: Michael Litter, MD;  Location: [facility];  Service: Obstetrics;  Laterality: N/A;  Repeat Cesarean Section Delivery Girl @ 0158, Apgars 7/9    Family History  Problem Relation Age of Onset  . Kidney disease Father        STONES  . Congenital heart disease Daughter        died 66 days of age  . Birth defects Daughter        HEART DEFECT  . Hypertension Mother   . Arthritis Mother   . Heart disease Mother   . Hypothyroidism Mother   . Atrial fibrillation Mother   . Birth defects Cousin        HYDOCEPHALIC  . Diabetes Maternal Uncle   . Hypothyroidism Sister     Social History   Socioeconomic History  . Marital status: Married    Spouse name: JHEB BOUYAHYA  . Number of children: 2  . Years of education: 77  . Highest education level: Not on file  Occupational History  . 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 Smoking status: Never Smoker  . Smokeless tobacco: Never Used  Substance and Sexual Activity  .  Alcohol use: No  . Drug use: No  . Sexual activity: Yes    Partners: Male    Birth control/protection: None  Other Topics Concern  . Not on file  Social History Narrative  . Not on file   Social Determinants of Health   Financial Resource Strain:   . Difficulty of Paying Living Expenses: Not on file  Food Insecurity:   . Worried About Programme researcher, broadcasting/film/video in the Last Year: Not on file  . Ran Out of Food in the Last Year: Not on file  Transportation Needs:   . Lack of Transportation (Medical): Not on file  . Lack of Transportation (Non-Medical): Not on file  Physical Activity:   . Days of Exercise per Week: Not on file  . Minutes of Exercise per Session: Not on file  Stress:   . Feeling of Stress : Not on file  Social Connections:   . Frequency of Communication with Friends and Family: Not on file  . Frequency of Social Gatherings with Friends and Family: Not on file  . Attends Religious Services: Not on file  . Active Member of Clubs or Organizations: Not on file  .  Attends Banker Meetings: Not on file  . Marital Status: Not on file  Intimate Partner Violence:   . Fear of Current or Ex-Partner: Not on file  . Emotionally Abused: Not on file  . Physically Abused: Not on file  . Sexually Abused: Not on file    ROS Review of Systems  Constitutional: Negative.   HENT: Negative.   Eyes: Negative.   Respiratory: Negative.   Cardiovascular: Negative.   Gastrointestinal: Negative.   Endocrine: Negative.   Genitourinary: Negative.   Musculoskeletal: Negative.   Allergic/Immunologic: Negative.   Neurological: Negative.   Hematological: Negative.   Psychiatric/Behavioral: Negative.     Objective:   Today's Vitals: BP 123/80 (BP Location: Left Arm, Patient Position: Sitting, Cuff Size: Normal)   Pulse 77   Temp 98.7 F (37.1 C) (Oral)   Resp 18   Ht 5\' 2"  (1.575 m)   Wt 147 lb (66.7 kg)   SpO2 100%   BMI 26.89 kg/m   Physical Exam Vitals and  nursing note reviewed.   BP 123/80 (BP Location: Left Arm, Patient Position: Sitting, Cuff Size: Normal)   Pulse 77   Temp 98.7 F (37.1 C) (Oral)   Resp 18   Ht 5\' 2"  (1.575 m)   Wt 147 lb (66.7 kg)   SpO2 100%   BMI 26.89 kg/m   General Appearance:    Alert, cooperative, no distress, appears stated age  Head:    Normocephalic, without obvious abnormality, atraumatic  Eyes:    PERRL, conjunctiva/corneas clear, EOM's intact, fundi    benign, both eyes  Ears:    Normal TM's and external ear canals, both ears  Nose:   Nares normal, septum midline, mucosa normal, no drainage    or sinus tenderness  Throat:   Lips, mucosa, and tongue normal; teeth and gums normal  Neck:   Supple, symmetrical, trachea midline, no adenopathy;    thyroid:  no enlargement/tenderness/nodules; no carotid   bruit or JVD  Back:     Symmetric, no curvature, ROM normal, no CVA tenderness  Lungs:     Clear to auscultation bilaterally, respirations unlabored  Chest Wall:    No tenderness or deformity   Heart:    Regular rate and rhythm, S1 and S2 normal, no murmur, rub   or gallop     Abdomen:     Soft, non-tender, bowel sounds active all four quadrants,    no masses, no organomegaly        Extremities:   Extremities normal, atraumatic, no cyanosis or edema  Pulses:   2+ and symmetric all extremities  Skin:   Skin color, texture, turgor normal, no rashes or lesions  Lymph nodes:   Cervical, supraclavicular, and axillary nodes normal  Neurologic:   CNII-XII intact, normal strength, sensation and reflexes    throughout     Assessment & Plan:   Problem List Items Addressed This Visit    None    Visit Diagnoses    Hypothyroidism, unspecified type    -  Primary   Relevant Orders   CBC with Differential/Platelet (Completed)   Comp. Metabolic Panel (12) (Completed)   Thyroid Panel With TSH (Completed)   Wellness examination       Screening, lipid       Relevant Orders   Lipid panel (Completed)    Screening for HIV without presence of risk factors       Relevant Orders   HIV antibody (with reflex) (Completed)  Encounter for HCV screening test for low risk patient       Relevant Orders   HCV Ab w/Rflx to Verification (Completed)      Outpatient Encounter Medications as of 03/16/2020  Medication Sig  . ibuprofen (ADVIL,MOTRIN) 600 MG tablet Take 600 mg by mouth daily as needed for pain.  Marland Kitchen levothyroxine (SYNTHROID, LEVOTHROID) 88 MCG tablet Take 88 mcg by mouth daily.  . [DISCONTINUED] Prenatal Vit-Fe Fumarate-FA (PRENATAL MULTIVITAMIN) TABS Take 1 tablet by mouth daily.  . SUMAtriptan (IMITREX) 100 MG tablet Take 1 tablet (100 mg total) by mouth every 2 (two) hours as needed for migraine. (Patient not taking: Reported on 03/16/2020)   No facility-administered encounter medications on file as of 03/16/2020.  1. Hypothyroidism, unspecified type Continue current regimen, will refill medication after labs have been resulted.  [facility], appointment given. - CBC with Differential/Platelet - Comp. Metabolic Panel (12) - Thyroid Panel With TSH  2. Wellness examination Patient education given on preventive health maintenance  3. Screening, lipid  - Lipid panel  4. Screening for HIV without presence of risk factors  - HIV antibody (with reflex)  5. Encounter for HCV screening test for low risk patient  - HCV Ab w/Rflx to Verification   I have reviewed the patient's medical history (PMH, PSH, Social History, Family History, Medications, and allergies) , and have been updated if relevant. I spent 30 minutes reviewing chart and  face to face time with patient.    Follow-up: Return in about 3 months (around 06/14/2020) for To establish PCP, At [facility].   Kasandra Knudsen Mayers, PA-C

## 2020-03-17 LAB — CBC WITH DIFFERENTIAL/PLATELET
Basophils Absolute: 0 10*3/uL (ref 0.0–0.2)
Basos: 1 %
EOS (ABSOLUTE): 0 10*3/uL (ref 0.0–0.4)
Eos: 0 %
Hematocrit: 42.8 % (ref 34.0–46.6)
Hemoglobin: 14.3 g/dL (ref 11.1–15.9)
Immature Grans (Abs): 0 10*3/uL (ref 0.0–0.1)
Immature Granulocytes: 0 %
Lymphocytes Absolute: 1.6 10*3/uL (ref 0.7–3.1)
Lymphs: 27 %
MCH: 29.8 pg (ref 26.6–33.0)
MCHC: 33.4 g/dL (ref 31.5–35.7)
MCV: 89 fL (ref 79–97)
Monocytes Absolute: 0.3 10*3/uL (ref 0.1–0.9)
Monocytes: 5 %
Neutrophils Absolute: 3.9 10*3/uL (ref 1.4–7.0)
Neutrophils: 67 %
Platelets: 202 10*3/uL (ref 150–450)
RBC: 4.8 x10E6/uL (ref 3.77–5.28)
RDW: 12.5 % (ref 11.7–15.4)
WBC: 5.9 10*3/uL (ref 3.4–10.8)

## 2020-03-17 LAB — COMP. METABOLIC PANEL (12)
AST: 22 IU/L (ref 0–40)
Albumin/Globulin Ratio: 1.7 (ref 1.2–2.2)
Albumin: 4.3 g/dL (ref 3.8–4.8)
Alkaline Phosphatase: 58 IU/L (ref 44–121)
BUN/Creatinine Ratio: 15 (ref 9–23)
BUN: 10 mg/dL (ref 6–24)
Bilirubin Total: 0.5 mg/dL (ref 0.0–1.2)
Calcium: 9.1 mg/dL (ref 8.7–10.2)
Chloride: 102 mmol/L (ref 96–106)
Creatinine, Ser: 0.66 mg/dL (ref 0.57–1.00)
GFR calc Af Amer: 123 mL/min/{1.73_m2} (ref >59)
GFR calc non Af Amer: 107 mL/min/{1.73_m2} (ref >59)
Globulin, Total: 2.6 g/dL (ref 1.5–4.5)
Glucose: 98 mg/dL (ref 65–99)
Potassium: 4.1 mmol/L (ref 3.5–5.2)
Sodium: 138 mmol/L (ref 134–144)
Total Protein: 6.9 g/dL (ref 6.0–8.5)

## 2020-03-17 LAB — THYROID PANEL WITH TSH
Free Thyroxine Index: 2.4 (ref 1.2–4.9)
T3 Uptake Ratio: 27 % (ref 24–39)
T4, Total: 8.8 ug/dL (ref 4.5–12.0)
TSH: 1.71 u[IU]/mL (ref 0.450–4.500)

## 2020-03-17 LAB — HIV ANTIBODY (ROUTINE TESTING W REFLEX): HIV Screen 4th Generation wRfx: NONREACTIVE

## 2020-03-17 LAB — LIPID PANEL
Chol/HDL Ratio: 4.4 ratio (ref 0.0–4.4)
Cholesterol, Total: 236 mg/dL — ABNORMAL HIGH (ref 100–199)
HDL: 54 mg/dL (ref >39)
LDL Chol Calc (NIH): 163 mg/dL — ABNORMAL HIGH (ref 0–99)
Triglycerides: 105 mg/dL (ref 0–149)
VLDL Cholesterol Cal: 19 mg/dL (ref 5–40)

## 2020-03-17 LAB — HCV INTERPRETATION

## 2020-03-17 LAB — HCV AB W/RFLX TO VERIFICATION: HCV Ab: <0.1 s/co ratio (ref 0.0–0.9)

## 2020-03-17 NOTE — Telephone Encounter (Signed)
Patient was able to be scheduled with covering provider @ [facility].

## 2020-03-21 ENCOUNTER — Telehealth: Payer: Self-pay | Admitting: Internal Medicine

## 2020-03-21 NOTE — Telephone Encounter (Signed)
Copied from CRM [id number]. Topic: General - Other >> Mar 21, 2020 12:11 PM Gwenlyn Fudge wrote: Reason for CRM: Pt called and is requesting to have a nurse give her a call back regarding her lab results. Please advise.

## 2020-03-22 NOTE — Telephone Encounter (Signed)
-----   Message from Roney Jaffe, [place] sent at 03/20/2020  1:32 PM EST ----- Please call patient and let her know that her kidney, liver, thyroid functions were within normal limits.  Her screening for hepatitis C and HIV were negative.  Her blood count was normal, no signs of anemia.  Her cholesterol was elevated, Her risk of a cardiovascular event in the next 10 years is 1%, it is recommended that she follow a low-cholesterol diet and be screened at least yearly.  No prescription medication is recommended at this time  The 10-year ASCVD risk score Denman George DC Montez Hageman., et al., 2013) is: 1%   Values used to calculate the score:     Age: 89 years     Sex: Female     Is Non-Hispanic African American: No     Diabetic: No     Tobacco smoker: No     Systolic Blood Pressure: 123 mmHg     Is BP treated: No     HDL Cholesterol: 54 mg/dL     Total Cholesterol: 236 mg/dL

## 2020-03-22 NOTE — Telephone Encounter (Signed)
Patient verified DOB Patient is aware of labs being normal and to continue current medication and keep virtual NP appointment in February.

## 2020-06-08 ENCOUNTER — Telehealth: Payer: Self-pay | Admitting: Internal Medicine

## 2020-06-21 ENCOUNTER — Other Ambulatory Visit: Payer: Self-pay

## 2020-06-21 ENCOUNTER — Telehealth (INDEPENDENT_AMBULATORY_CARE_PROVIDER_SITE_OTHER): Payer: Self-pay | Admitting: Internal Medicine

## 2020-06-21 ENCOUNTER — Encounter: Payer: Self-pay | Admitting: Internal Medicine

## 2020-06-21 DIAGNOSIS — Z7689 Persons encountering health services in other specified circumstances: Secondary | ICD-10-CM

## 2020-06-21 DIAGNOSIS — E039 Hypothyroidism, unspecified: Secondary | ICD-10-CM

## 2020-06-21 DIAGNOSIS — G8929 Other chronic pain: Secondary | ICD-10-CM

## 2020-06-21 DIAGNOSIS — E785 Hyperlipidemia, unspecified: Secondary | ICD-10-CM | POA: Insufficient documentation

## 2020-06-21 DIAGNOSIS — M25561 Pain in right knee: Secondary | ICD-10-CM

## 2020-06-21 NOTE — Progress Notes (Signed)
Virtual Visit via Telephone Note  I connected with Carmen West, on 06/21/2020 at 3:09 PM by telephone due to the COVID-19 pandemic and verified that I am speaking with the correct person using two identifiers.   Consent: I discussed the limitations, risks, security and privacy concerns of performing an evaluation and management service by telephone and the availability of in person appointments. I also discussed with the patient that there may be a patient responsible charge related to this service. The patient expressed understanding and agreed to proceed.   Location of Patient: Home   Location of Provider: Clinic    Persons participating in Telemedicine visit: Eulah Makinzey Banes Dr. Earlene Plater      History of Present Illness: Patient has a visit to establish care. Patient has a PMH of Hypothyroidism on Synthroid. Also has a history of HLD. No fatigue, changes in hair/nails, hot/cold intolerance, or unintended weight changes. Has a history of CS x3. No other medications aside from Synthroid.   Endorses chronic right knee pain for one year. Bothers her when she walks or when she goes up the stairs. Feels like the pain is deep inside. No popping, locking, giving way. No known injury to the knee. Just started exercise regimen 3 days ago but was previously not particularly physically active.    The 10-year ASCVD risk score Denman George DC Montez Hageman., et al., 2013) is: 1%   Values used to calculate the score:     Age: 45 years     Sex: Female     Is Non-Hispanic African American: No     Diabetic: No     Tobacco smoker: No     Systolic Blood Pressure: 123 mmHg     Is BP treated: No     HDL Cholesterol: 54 mg/dL     Total Cholesterol: 236 mg/dL    Past Medical History:  Diagnosis Date  . Anxiety    some  . Depression    AFTER LOSS OF BABY;  NO MEDS  . Headache, migraine    IMPROVED  . Hyperlipidemia   . Hypothyroidism 2009   DR JAEJALI  . Pregnant    No Known  Allergies  Current Outpatient Medications on File Prior to Visit  Medication Sig Dispense Refill  . levothyroxine (SYNTHROID, LEVOTHROID) 88 MCG tablet Take 88 mcg by mouth daily.    Marland Kitchen ibuprofen (ADVIL,MOTRIN) 600 MG tablet Take 600 mg by mouth daily as needed for pain. (Patient not taking: Reported on 06/21/2020)    . SUMAtriptan (IMITREX) 100 MG tablet Take 1 tablet (100 mg total) by mouth every 2 (two) hours as needed for migraine. (Patient not taking: No sig reported) 15 tablet 6   No current facility-administered medications on file prior to visit.    Observations/Objective: NAD. Speaking clearly.  Work of breathing normal.  Alert and oriented. Mood appropriate.   Assessment and Plan: 1. Encounter to establish care Reviewed patient's PMH, social history, surgical history, and medications.   2. Hyperlipidemia, unspecified hyperlipidemia type Reviewed lipid results with patient. ASCVD risk is 1%. Would recommend aiming for 150 minutes of cardiac exercise per week. Advise to decrease animal fat intake, saturated fats, processed foods. Increase fiber rich foods such as fruits and veggies. Recommended follow up in Dec 2022 for monitoring.   3. Hypothyroidism, unspecified type Reviewed thyroid panel that was normal in Dec 2021. Will monitor yearly. Continue Synthroid.   4. Chronic pain of right knee Possible that patient has OA of the  knee. Is slightly overweight on review of BMI. PFP is a possibility although would anticipate history of chronic exercise and more of sensation of giving way. No known injury to suggest meniscal or ligament strain or tear; although in her age group could occur with minor trauma. Will refer to [facility] for further evaluation.  - Ambulatory referral to Sports Medicine   Follow Up Instructions: Annual exam Dec 2022; SM referral    I discussed the assessment and treatment plan with the patient. The patient was provided an opportunity to ask questions and all were  answered. The patient agreed with the plan and demonstrated an understanding of the instructions.   The patient was advised to call back or seek an in-person evaluation if the symptoms worsen or if the condition fails to improve as anticipated.     I provided 11 minutes total of non-face-to-face time during this encounter including median intraservice time, reviewing previous notes, investigations, ordering medications, medical decision making, coordinating care and patient verbalized understanding at the end of the visit.    Marcy Siren, D.O. Primary Care at [facility]  06/21/2020, 3:09 PM

## 2020-12-18 ENCOUNTER — Other Ambulatory Visit: Payer: Self-pay

## 2020-12-18 ENCOUNTER — Emergency Department (HOSPITAL_COMMUNITY)
Admission: EM | Admit: 2020-12-18 | Discharge: 2020-12-18 | Disposition: A | Discharge status: Home / Self Care | Payer: Self-pay | Attending: Emergency Medicine | Admitting: Emergency Medicine

## 2020-12-18 ENCOUNTER — Emergency Department (HOSPITAL_COMMUNITY): Payer: Self-pay

## 2020-12-18 DIAGNOSIS — M25512 Pain in left shoulder: Secondary | ICD-10-CM | POA: Insufficient documentation

## 2020-12-18 DIAGNOSIS — E039 Hypothyroidism, unspecified: Secondary | ICD-10-CM | POA: Insufficient documentation

## 2020-12-18 DIAGNOSIS — R200 Anesthesia of skin: Secondary | ICD-10-CM | POA: Insufficient documentation

## 2020-12-18 DIAGNOSIS — Z79899 Other long term (current) drug therapy: Secondary | ICD-10-CM | POA: Insufficient documentation

## 2020-12-18 NOTE — ED Provider Notes (Signed)
[facility] EMERGENCY DEPARTMENT Provider Note   CSN: [id number] Arrival date & time: 12/18/20  1131     History Chief Complaint  Patient presents with   Arm Pain    Carmen West is a 45 y.o. female.  Carmen West presents with left shoulder pain.  This occurred after waking up with this pain.  She has had it in the past, and she denies any injury.  She states that main reason she came in is because her mother-in-law died of a heart attack after experiencing some shoulder pain.  The history is provided by the patient.  Shoulder Pain Location:  Shoulder Shoulder location:  L shoulder Pain details:    Quality:  Aching   Radiates to:  Does not radiate   Severity:  Moderate   Onset quality:  Sudden   Duration:  1 day   Timing:  Constant   Progression:  Improving Handedness:  Right-handed Prior injury to area:  No Relieved by: topical treatments. Worsened by:  Movement Associated symptoms: numbness (intermittent numbness of left arm and hand; had EMG in [place] years ago which was normal; no new neurologic symptoms)   Associated symptoms: no back pain, no decreased range of motion, no fever, no muscle weakness and no neck pain       Past Medical History:  Diagnosis Date   Anxiety    some   Depression    AFTER LOSS OF BABY;  NO MEDS   Headache, migraine    IMPROVED   Hyperlipidemia    Hypothyroidism 2009   [facility]   Pregnant     Patient Active Problem List   Diagnosis Date Noted   Hyperlipidemia 06/21/2020   Hypothyroidism 11/10/2011   Family history of congenital heart defect 08/22/2011    Past Surgical History:  Procedure Laterality Date   CESAREAN SECTION  2013,2008,2012   lost child(2012)   CESAREAN SECTION  03/25/2012   Procedure: CESAREAN SECTION;  Surgeon: Michael Litter, MD;  Location: [facility];  Service: Obstetrics;  Laterality: N/A;  Repeat Cesarean Section Delivery Girl @ 0158, Apgars 7/9     OB History      Gravida  3   Para  3   Term  3   Preterm      AB      Living  2      SAB      IAB      Ectopic      Multiple      Live Births  3           Family History  Problem Relation Age of Onset   Kidney disease Father        STONES   Congenital heart disease Daughter        died 32 days of age   Birth defects Daughter        HEART DEFECT   Hypertension Mother    Arthritis Mother    Heart disease Mother    Hypothyroidism Mother    Atrial fibrillation Mother    Birth defects Cousin        HYDOCEPHALIC   Diabetes Maternal Uncle    Hypothyroidism Sister     Social History   Tobacco Use   Smoking status: Never   Smokeless tobacco: Never  Substance Use Topics   Alcohol use: No   Drug use: No    Home Medications Prior to Admission medications   Medication Sig Start  Date End Date Taking? Authorizing Provider  ibuprofen (ADVIL,MOTRIN) 600 MG tablet Take 600 mg by mouth daily as needed for pain. Patient not taking: Reported on 06/21/2020    [provider]  levothyroxine (SYNTHROID, LEVOTHROID) 88 MCG tablet Take 88 mcg by mouth daily.    [provider]  SUMAtriptan (IMITREX) 100 MG tablet Take 1 tablet (100 mg total) by mouth every 2 (two) hours as needed for migraine. Patient not taking: No sig reported 09/16/12   Levert Feinstein, MD    Allergies    Patient has no known allergies.  Review of Systems   Review of Systems  Constitutional:  Negative for chills and fever.  HENT:  Negative for ear pain and sore throat.   Eyes:  Negative for pain and visual disturbance.  Respiratory:  Negative for cough and shortness of breath.   Cardiovascular:  Negative for chest pain and palpitations.  Gastrointestinal:  Negative for abdominal pain and vomiting.  Genitourinary:  Negative for dysuria and hematuria.  Musculoskeletal:  Negative for arthralgias, back pain and neck pain.  Skin:  Negative for color change and rash.  Neurological:  Negative for seizures  and syncope.  All other systems reviewed and are negative.  Physical Exam Updated Vital Signs BP (!) 129/91   Pulse 81   Temp 99.6 F (37.6 C) (Oral)   Resp 18   SpO2 100%   Physical Exam Vitals and nursing note reviewed.  HENT:     Head: Normocephalic and atraumatic.  Eyes:     General: No scleral icterus. Pulmonary:     Effort: Pulmonary effort is normal. No respiratory distress.  Musculoskeletal:     Cervical back: Normal range of motion. No tenderness.     Comments: Left shoulder is normal to inspection.  No tenderness to palpation.  Full range of motion with mild pain at terminal abduction.  Skin:    General: Skin is warm and dry.  Neurological:     General: No focal deficit present.     Mental Status: She is alert and oriented to person, place, and time.     Sensory: No sensory deficit.     Motor: No weakness.  Psychiatric:        Mood and Affect: Mood normal.    ED Results / Procedures / Treatments   Labs (all labs ordered are listed, but only abnormal results are displayed) Labs Reviewed - No data to display  EKG EKG Interpretation  Date/Time:  Monday December 18 2020 13:51:50 EDT Ventricular Rate:  59 PR Interval:  124 QRS Duration: 95 QT Interval:  418 QTC Calculation: 415 R Axis:   74 Text Interpretation: Sinus rhythm ST elev, probable normal early repol pattern normal axis No acute ischemia Confirmed by Pieter Partridge ([id number]) on 12/18/2020 1:53:42 PM  Radiology DG Humerus Left  Result Date: 12/18/2020 CLINICAL DATA:  Left entire humerus pain x this am EXAM: LEFT HUMERUS - 2+ VIEW COMPARISON:  None. FINDINGS: There is no evidence of fracture or other focal bone lesions. Soft tissues are unremarkable. IMPRESSION: Negative. Electronically Signed   By: Emmaline Kluver M.D.   On: 12/18/2020 13:32    Procedures Procedures   Medications Ordered in ED Medications - No data to display  ED Course  I have reviewed the triage vital signs and the nursing  notes.  Pertinent labs & imaging results that were available during my care of the patient were reviewed by me and considered in my medical decision making (see  chart for details).    MDM Rules/Calculators/A&P                           [facility] presents with left shoulder pain.  This occurred after she awoke this morning.  Pain is worse with movement of the arm.  I suspect likely rotator cuff syndrome.  Less likely would be a cervical spine problem.  Nonetheless, she is neurologically normal.  I did consider ACS or other cardiopulmonary source.  Pain is much worse with movement of the arm, and she has no associated symptoms.  She is also at low risk for any cardiac condition.  EKG was performed and was normal.  She was given strict return precautions, and she was also given information regarding an orthopedic surgery referral for her shoulder pain. Final Clinical Impression(s) / ED Diagnoses Final diagnoses:  Acute pain of left shoulder    Rx / DC Orders ED Discharge Orders     None        Koleen Distance, MD 12/18/20 [phone]

## 2020-12-18 NOTE — ED Notes (Signed)
Pt transported to XRAY °

## 2020-12-18 NOTE — ED Triage Notes (Addendum)
Pt here POV d/t left arm pain. Pt woke up and it hurt. Pt able to move arm freely during triage. Constant pain when using arm. Does not radiate. Denies trauma

## 2020-12-18 NOTE — ED Notes (Signed)
Patient verbalizes understanding of discharge instructions. Opportunity for questioning and answers were provided. Armband removed by staff, pt discharged from ED and ambulated to lobby to return home.   

## 2021-04-23 ENCOUNTER — Emergency Department (HOSPITAL_COMMUNITY): Payer: 59

## 2021-04-23 ENCOUNTER — Emergency Department (HOSPITAL_COMMUNITY)
Admission: EM | Admit: 2021-04-23 | Discharge: 2021-04-24 | Disposition: A | Discharge status: Home / Self Care | Payer: 59 | Attending: Emergency Medicine | Admitting: Emergency Medicine

## 2021-04-23 ENCOUNTER — Encounter (HOSPITAL_COMMUNITY): Payer: Self-pay

## 2021-04-23 ENCOUNTER — Other Ambulatory Visit: Payer: Self-pay

## 2021-04-23 DIAGNOSIS — R002 Palpitations: Secondary | ICD-10-CM | POA: Insufficient documentation

## 2021-04-23 DIAGNOSIS — R739 Hyperglycemia, unspecified: Secondary | ICD-10-CM | POA: Insufficient documentation

## 2021-04-23 LAB — TSH: TSH: 1.818 u[IU]/mL (ref 0.350–4.500)

## 2021-04-23 LAB — BASIC METABOLIC PANEL
Anion gap: 9 (ref 5–15)
BUN: 13 mg/dL (ref 6–20)
CO2: 24 mmol/L (ref 22–32)
Calcium: 9.4 mg/dL (ref 8.9–10.3)
Chloride: 104 mmol/L (ref 98–111)
Creatinine, Ser: 0.67 mg/dL (ref 0.44–1.00)
GFR, Estimated: >60 mL/min (ref >60)
Glucose, Bld: 120 mg/dL — ABNORMAL HIGH (ref 70–99)
Potassium: 3.7 mmol/L (ref 3.5–5.1)
Sodium: 137 mmol/L (ref 135–145)

## 2021-04-23 LAB — CBC
HCT: 43.4 % (ref 36.0–46.0)
Hemoglobin: 14.4 g/dL (ref 12.0–15.0)
MCH: 29.1 pg (ref 26.0–34.0)
MCHC: 33.2 g/dL (ref 30.0–36.0)
MCV: 87.7 fL (ref 80.0–100.0)
Platelets: 208 10*3/uL (ref 150–400)
RBC: 4.95 MIL/uL (ref 3.87–5.11)
RDW: 12.7 % (ref 11.5–15.5)
WBC: 9.4 10*3/uL (ref 4.0–10.5)
nRBC: 0 % (ref 0.0–0.2)

## 2021-04-23 LAB — TROPONIN I (HIGH SENSITIVITY): Troponin I (High Sensitivity): <2 ng/L (ref <18)

## 2021-04-23 NOTE — ED Triage Notes (Signed)
Patient c/o feeling like her heart  is racing. Patient tearful in triage. Patient denies any SOB. Patient states, "I might be having a panic attack. I am very homesick."

## 2021-04-23 NOTE — ED Notes (Signed)
Save blue tube in main lab °

## 2021-04-23 NOTE — ED Provider Triage Note (Signed)
Emergency Medicine Provider Triage Evaluation Note  Carmen West , a 46 y.o. female  was evaluated in triage.  Pt complains of palpitations.  Patient reports intermittent palpitations over the past 10 days.  She reports it started off with a stressor around that time.  She does have history of hypothyroidism and is compliant with levothyroxine.  She reports she plans to establish a PCP and has a first appointment with them Wednesday.  She denies chest pain, shortness of breath during these episodes.  She reports she had an episode prior to arrival which lasted about 20 minutes.  Currently she is symptom-free.  Review of Systems  Positive: As above Negative: As above  Physical Exam  BP (!) 143/85 (BP Location: Right Arm)    Pulse (!) 105    Temp 98.6 F (37 C) (Oral)    Resp 18    Ht 5' (1.524 m)    Wt 68.9 kg    LMP 04/08/2021    SpO2 100%    BMI 29.69 kg/m  Gen:   Awake, no distress   Resp:  Normal effort  MSK:   Moves extremities without difficulty  Other:   Medical Decision Making  Medically screening exam initiated at 7:11 PM.  Appropriate orders placed.  [place] was informed that the remainder of the evaluation will be completed by another provider, this initial triage assessment does not replace that evaluation, and the importance of remaining in the ED until their evaluation is complete.     Evlyn Courier, PA-C 04/23/21 1912

## 2021-04-24 LAB — TROPONIN I (HIGH SENSITIVITY): Troponin I (High Sensitivity): <2 ng/L (ref <18)

## 2021-04-24 NOTE — ED Provider Notes (Signed)
[facility]-EMERGENCY DEPT Provider Note   CSN: [id number] Arrival date & time: 04/23/21  1836     History  Chief Complaint  Patient presents with   Tachycardia    Carmen West is a 46 y.o. female.  The history is provided by the patient and medical records.   46 y.o. F with hx of palpitations, presenting to the ED for palpitations.  Patient states she has been having these for nearly 10 years.  Was previously seeing a cardiologist in [place] who gave her PRN propanolol to take when needed if having palpitations.  States most of the time if this occurs it is in the morning after she takes her thyroid medicine but before she eats her breakfast.  This does not happen every morning.  States usually will last 15 to 20 minutes and then resolve spontaneously.  She does not have associated chest pain or shortness of breath with this.  States recently she has been under a lot of stress and has been feeling quite homesick.  She does not currently have a cardiologist here in the [place].  Has had prior Holter monitoring and echocardiogram that were reportedly normal.  Home Medications Prior to Admission medications   Medication Sig Start Date End Date Taking? Authorizing Provider  levothyroxine (SYNTHROID, LEVOTHROID) 88 MCG tablet Take 88 mcg by mouth daily.   Yes [provider]      Allergies    Patient has no known allergies.    Review of Systems   Review of Systems  Cardiovascular:  Positive for palpitations.  All other systems reviewed and are negative.  Physical Exam Updated Vital Signs BP (!) 143/85 (BP Location: Right Arm)    Pulse 81    Temp 98.1 F (36.7 C) (Oral)    Resp 17    Ht 5' (1.524 m)    Wt 68.9 kg    LMP 04/08/2021    SpO2 100%    BMI 29.69 kg/m   Physical Exam Vitals and nursing note reviewed.  Constitutional:      Appearance: She is well-developed.  HENT:     Head: Normocephalic and atraumatic.  Eyes:     Conjunctiva/sclera:  Conjunctivae normal.     Pupils: Pupils are equal, round, and reactive to light.  Cardiovascular:     Rate and Rhythm: Normal rate and regular rhythm.     Heart sounds: Normal heart sounds.  Pulmonary:     Effort: Pulmonary effort is normal. No respiratory distress.     Breath sounds: Normal breath sounds. No rhonchi.  Abdominal:     General: Bowel sounds are normal.     Palpations: Abdomen is soft.     Tenderness: There is no abdominal tenderness. There is no rebound.  Musculoskeletal:        General: Normal range of motion.     Cervical back: Normal range of motion.  Skin:    General: Skin is warm and dry.  Neurological:     Mental Status: She is alert and oriented to person, place, and time.  Psychiatric:     Comments: Very anxious appearing    ED Results / Procedures / Treatments   Labs (all labs ordered are listed, but only abnormal results are displayed) Labs Reviewed  BASIC METABOLIC PANEL - Abnormal; Notable for the following components:      Result Value   Glucose, Bld 120 (*)    All other components within normal limits  CBC  TSH  TROPONIN I (HIGH SENSITIVITY)  TROPONIN I (HIGH SENSITIVITY)    EKG EKG Interpretation  Date/Time:  Monday April 23 2021 18:50:47 EST Ventricular Rate:  90 PR Interval:  117 QRS Duration: 94 QT Interval:  354 QTC Calculation: 434 R Axis:   13 Text Interpretation: Sinus rhythm Borderline short PR interval similar to prior Confirmed by Tanda Rockers ([id number]) on 04/24/2021 4:35:07 AM  Radiology DG Chest 2 View  Result Date: 04/23/2021 CLINICAL DATA:  Palpitations EXAM: CHEST - 2 VIEW COMPARISON:  None. FINDINGS: Cardiac and mediastinal contours are within normal limits. No focal pulmonary opacity. No pleural effusion or pneumothorax. No acute osseous abnormality. IMPRESSION: No acute cardiopulmonary process. Electronically Signed   By: Wiliam Ke M.D.   On: 04/23/2021 19:50    Procedures Procedures    Medications Ordered in  ED Medications - No data to display  ED Course/ Medical Decision Making/ A&P                           Medical Decision Making  46 year old female presenting to the ED with palpitations.  Ongoing issue for her over the past several years.  She uses PRN propanolol from prior cardiologist.  She is afebrile and nontoxic in appearance here.  Her vitals are stable.  Labs ordered and reviewed, overall reassuring with negative troponin x2, normal TSH.  Her chest x-ray is clear.  She has not had any profound tachycardia or recurrent palpitations while here in the ED.  She does appear very anxious, seemingly focusing on having further palpitations which is stressing her out.  Also reports to checking her blood pressure repeatedly during the day which is causing her some anxiety as well.  I recommended that she only check her pressure no more than twice daily, preferably same time each day.  She does request follow-up with cardiologist so we will provide referral.  Otherwise can follow-up with her primary care doctor.  She may return here for any new or acute changes.  Final Clinical Impression(s) / ED Diagnoses Final diagnoses:  Palpitations    Rx / DC Orders ED Discharge Orders     None         Garlon Hatchet, PA-C 04/24/21 0532    Sloan Leiter, DO 04/24/21 [phone]

## 2021-04-24 NOTE — Discharge Instructions (Signed)
All your labs, imaging, and EKG today were normal. Please call cardiology office to make  follow-up appt. Return here for new concerns.

## 2021-04-25 ENCOUNTER — Other Ambulatory Visit: Payer: Self-pay

## 2021-04-25 ENCOUNTER — Encounter: Payer: Self-pay | Admitting: Internal Medicine

## 2021-04-25 ENCOUNTER — Ambulatory Visit (INDEPENDENT_AMBULATORY_CARE_PROVIDER_SITE_OTHER): Payer: 59 | Admitting: Internal Medicine

## 2021-04-25 VITALS — BP 153/75 | HR 105 | Temp 98.6°F | Wt 146.7 lb

## 2021-04-25 DIAGNOSIS — R030 Elevated blood-pressure reading, without diagnosis of hypertension: Secondary | ICD-10-CM

## 2021-04-25 DIAGNOSIS — R002 Palpitations: Secondary | ICD-10-CM | POA: Diagnosis not present

## 2021-04-25 DIAGNOSIS — E039 Hypothyroidism, unspecified: Secondary | ICD-10-CM | POA: Diagnosis not present

## 2021-04-25 DIAGNOSIS — G8929 Other chronic pain: Secondary | ICD-10-CM

## 2021-04-25 DIAGNOSIS — M25561 Pain in right knee: Secondary | ICD-10-CM

## 2021-04-25 DIAGNOSIS — E663 Overweight: Secondary | ICD-10-CM

## 2021-04-25 MED ORDER — PROPRANOLOL HCL 20 MG PO TABS: 20.0000 mg | ORAL_TABLET | ORAL | 0 refills | Status: DC | PRN

## 2021-04-25 NOTE — Assessment & Plan Note (Addendum)
[name] presents to establish care with [facility] as PCP.  Patient has a history of hypothyroidism diagnosed back in [place]  Recently she has been following with [facility] endocrinology, last saw Dr. Jenelle West in April 2022.  Has prescription for 1000 MCG levothyroxine that was prescribed in [place].  Has daughter diagnosed with Hashimoto's thyroiditis and 2 sisters and her mother with diagnosis of hypothyroidism.  Endorses ~5 kg unintentional weight gain in the last year though reports it could be diet related.  Otherwise denies symptoms of hypothyroidism.  Recent TSH normal.  Minimal fullness anterior neck.  No nodules or masses.  On assessment, patient's hypothyroidism well-controlled on current regimen.  On review of Dr. Mort West note, etiology of hypothyroidism is unclear.  Patient has previously tested negative for anti-TPO, making Hashimoto's thyroiditis is less likely.  Patient has also spent periods of time off Synthroid medication with normal thyroid levels.  Per Dr. Jenelle West, etiology more likely thyroiditis versus multinodular goiter with autonomously functioning nodule.  Patient would like to transfer care to [facility].  Plan: -Continue 100 MCG Synthroid (prescribed in [place]) -Ambulatory referral to endocrinology

## 2021-04-25 NOTE — Assessment & Plan Note (Signed)
Patient reports 5-year history of right knee pain.  No injury to the knee.  She notices the pain when she is walking up steps.  Pain is not bothersome enough for her to take over-the-counter pain medication.  Was told previously to lose weight to alleviate symptoms.  Has not tried exercise regimen.  Does not use hot or cold therapy.  On exam, right knee without swelling, erythema or increased calor.  No anterior posterior laxity.  No varus or valgus laxity.  McMurray negative.  Standing McMurray negative.  Patient has mild tenderness to palpation below medial joint line.  On assessment, based on location of pain, this could be pes anserinus bursitis.  Discussed Motrin or Aleve for pain as well as other hot or cold therapy, whichever works better.  Discussed avoidance of aggravating activities, and keeping up with gentle exercise.  Plan: -Home exercise program to strengthen muscles around the knee -NSAIDs as needed for pain -Hot or cold therapy

## 2021-04-25 NOTE — Progress Notes (Signed)
°  CC: Establish care, endocrinology referral  HPI:  Ms.Carmen West is a 46 y.o. female with a past medical history stated below and presents today for establish care, endocrinology referral. Please see problem based assessment and plan for additional details.  Past Medical History:  Diagnosis Date   Anxiety    some   Depression    AFTER LOSS OF BABY;  NO MEDS   Headache, migraine    IMPROVED   Hyperlipidemia    Hypothyroidism 2009   DR JAEJALI   Pregnant     Current Outpatient Medications on File Prior to Visit  Medication Sig Dispense Refill   levothyroxine (SYNTHROID, LEVOTHROID) 88 MCG tablet Take 88 mcg by mouth daily.     No current facility-administered medications on file prior to visit.    Family History  Problem Relation Age of Onset   Hypertension Mother    Arthritis Mother    Heart disease Mother    Hypothyroidism Mother    Atrial fibrillation Mother    Hypertension Father    CVA Father    Hypothyroidism Sister    Congenital heart disease Daughter        died 60 days of age   Birth defects Daughter        HEART DEFECT   Diabetes Maternal Uncle    Birth defects Cousin        [place]    Social History: Originally from [place], 2001, has moved back and forth from [place] and the [place] multiple times, "it is a long story".  Currently lives in [place] with her husband and two daughters.  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  Lifelong non-smoker, alcohol no use at all, no illicit drug use.  Review of Systems: ROS negative except for what is noted on the assessment and plan.  Vitals:   04/25/21 1331 04/25/21 1431 04/25/21 1437  BP: (!) 158/72 (!) 156/83 (!) 153/75  Pulse: 87 (!) 105 (!) 105  Temp: 98.6 F (37 C)    TempSrc: Oral    SpO2: 100% 100%   Weight: 146 lb 11.2 oz (66.5 kg)      Physical Exam: General: Well appearing Mediterranean female, NAD HENT: normocephalic, atraumatic, MMM, minimal fullness  anterior neck without nodules or masses palpated EYES: conjunctiva non-erythematous, no scleral icterus, no exophthalmos CV: regular rate, normal rhythm, no murmurs, rubs, gallops.  No lower extremity edema. Pulmonary: normal work of breathing on RA, lungs clear to auscultation, no rales, wheezes, rhonchi Abdominal: non-distended, soft, non-tender to palpation, normal BS Skin: Warm and dry, no rashes or lesions Neurological: MS: awake, alert and oriented x3, normal speech and fund of knowledge Motor: moves all extremities antigravity MSK: Right knee without swelling, erythema or increased calor.  No anterior posterior laxity.  No varus or valgus laxity.  McMurray negative.  Standing McMurray negative.  Patient has mild tenderness to palpation below medial joint line. Psych: normal affect    Assessment & Plan:   See Encounters Tab for problem based charting.  Patient discussed with Dr. Abundio Miu, M.D. [facility] Internal Medicine, PGY-1 Pager: [phone] Date 04/25/2021 Time 4:31 PM

## 2021-04-25 NOTE — Patient Instructions (Addendum)
Thank you, Ms.Nakiah [facility] for allowing [place] to provide your care today. Today we discussed:  Palpitations: I have ordered 20 mg of propranolol to take as needed when you have palpitations.  Please do not take more than 1 or 2 a day.  Keep in mind that coffee and tea intake as well as your thyroid medication can contribute to your palpitations.  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  Lots of patients experience this as well.  I would encourage you to check your blood pressure once a week at home when you are relaxed and calm.  Please bring your blood pressure measurements at home with you to your next appointment.  Knee pain: Avoid activities that worsen your knee pain.  You can take Motrin or Aleve for pain relief if needed.  You can also try hot packs or ice packs and see if these are helpful.  I have attached knee exercises which can help with pain.  It is important to strengthen the muscles around the knee joint which can then prevent pain.    My Chart Access: [email or web address]  Please follow-up in 4 months for routine health visit.  Please make sure to arrive 15 minutes prior to your next appointment. If you arrive late, you may be asked to reschedule.    We look forward to seeing you next time. Please call our clinic at [phone] if you have any questions or concerns. The best time to call is Monday-Friday from 9am-4pm, but there is someone available 24/7. If after hours or the weekend, call the main hospital number and ask for the Internal Medicine Resident On-Call. If you need medication refills, please notify your pharmacy one week in advance and they will send [place] a request.   Thank you for letting us take part in your care. Wishing you the best!  Ellison Carwin, MD 04/25/2021, 2:55 PM IM Resident,  PGY-1

## 2021-04-25 NOTE — Assessment & Plan Note (Addendum)
Patient has a 10-year history of palpitations.  Previously in [place] she has been prescribed as needed propranolol 20 mg to take as needed for palpitations.  She has not taken this medication in a long time.  Patient had recent ED visit for palpitations.  Patient reports every morning after taking her thyroid medication, she will experience palpitations that will self resolve.  The other day her palpitations were more forceful and she was very anxious.  She called a friend who recommended she go to the ED.  ED work-up included EKG which showed NSR, negative troponins, TSH within normal limits.  CBC and BMP unremarkable.  Patient acknowledges that her palpitations occur when she is feeling very anxious and this makes her more anxious.  Has had a heart monitor placed previously in [place] and was told the results were normal.  On assessment, palpitations likely secondary to thyroid medication, coffee in the morning, anxiety.  Heart rate 80s today during OV.  Low concern for pathologic arrhythmias.  Plan: -As needed propranolol 20 mg as needed for palpitations -Continue thyroid medication as prescribed -Monitor coffee, tea intake and relation of palpitations to feelings of anxiety -GAD 6 and PHQ-9 7, consider addressing at next OV -Patient instructed to follow-up in 4 months in May with me, encouraged patient to make earlier appointment if needed

## 2021-04-25 NOTE — Assessment & Plan Note (Signed)
Blood pressure today during OV, 158/72.  On repeat check 156/83, with other arm 153/75.  Patient has a blood pressure cuff at home and reports that when she checks at home her blood pressures are elevated only when she is stressed and worried, and then her blood pressures will improve again when she has relaxed.  This is happened multiple times at home.  Additionally at recent ED visit patient's blood pressure initially elevated to 140s systolic in triage.  Went as high as 160s while patient was in the ED. and then later when patient was feeling more calm, blood pressure dropped to 124/85.  On assessment, patient likely has white coat hypertension and does not have a diagnosis of hypertension.  Discussed that it would be reasonable for her to check her blood pressures once a week at home when she is feeling calm and document these, and bring these to next appointment.  Plan: -Monitor BPs at home once a week

## 2021-04-26 DIAGNOSIS — E663 Overweight: Secondary | ICD-10-CM | POA: Insufficient documentation

## 2021-04-26 NOTE — Assessment & Plan Note (Addendum)
Requesting assistance to achieve goal of weight loss.  Has attempted to make dietary changes and lifestyle modifications on her own in the past with limited success.  Reports recent weight gain ~5 kg in the last year.  Plan: -Ambulatory referral to dietitian at [facility] for weight loss

## 2021-04-26 NOTE — Addendum Note (Signed)
Addended byVinetta Bergamo, Aidan Moten on: 04/26/2021 11:00 AM   Modules accepted: Orders

## 2021-05-02 NOTE — Progress Notes (Signed)
Internal Medicine Clinic Attending ? ?Case discussed with Dr. Zinoviev  At the time of the visit.  We reviewed the resident?s history and exam and pertinent patient test results.  I agree with the assessment, diagnosis, and plan of care documented in the resident?s note.  ?

## 2021-06-05 ENCOUNTER — Emergency Department (HOSPITAL_BASED_OUTPATIENT_CLINIC_OR_DEPARTMENT_OTHER)
Admission: EM | Admit: 2021-06-05 | Discharge: 2021-06-06 | Disposition: A | Discharge status: Home / Self Care | Payer: 59 | Attending: Emergency Medicine | Admitting: Emergency Medicine

## 2021-06-05 ENCOUNTER — Other Ambulatory Visit: Payer: Self-pay

## 2021-06-05 ENCOUNTER — Encounter (HOSPITAL_BASED_OUTPATIENT_CLINIC_OR_DEPARTMENT_OTHER): Payer: Self-pay | Admitting: *Deleted

## 2021-06-05 DIAGNOSIS — E039 Hypothyroidism, unspecified: Secondary | ICD-10-CM | POA: Diagnosis not present

## 2021-06-05 DIAGNOSIS — R002 Palpitations: Secondary | ICD-10-CM | POA: Insufficient documentation

## 2021-06-05 LAB — URINALYSIS, ROUTINE W REFLEX MICROSCOPIC
Bilirubin Urine: NEGATIVE
Glucose, UA: NEGATIVE mg/dL
Hgb urine dipstick: NEGATIVE
Ketones, ur: NEGATIVE mg/dL
Leukocytes,Ua: NEGATIVE
Nitrite: NEGATIVE
Protein, ur: NEGATIVE mg/dL
Specific Gravity, Urine: 1.01 (ref 1.005–1.030)
pH: 6 (ref 5.0–8.0)

## 2021-06-05 NOTE — ED Triage Notes (Signed)
Palpitations since 2015. She has been told it a result of anxiety. This episode started yesterday. Headache last night. Pain in her ears.

## 2021-06-06 ENCOUNTER — Encounter (HOSPITAL_BASED_OUTPATIENT_CLINIC_OR_DEPARTMENT_OTHER): Payer: Self-pay | Admitting: Emergency Medicine

## 2021-06-06 LAB — TSH: TSH: 1.313 u[IU]/mL (ref 0.350–4.500)

## 2021-06-06 LAB — CBC WITH DIFFERENTIAL/PLATELET
Abs Immature Granulocytes: 0.02 10*3/uL (ref 0.00–0.07)
Basophils Absolute: 0 10*3/uL (ref 0.0–0.1)
Basophils Relative: 0 %
Eosinophils Absolute: 0 10*3/uL (ref 0.0–0.5)
Eosinophils Relative: 0 %
HCT: 41.3 % (ref 36.0–46.0)
Hemoglobin: 14 g/dL (ref 12.0–15.0)
Immature Granulocytes: 0 %
Lymphocytes Relative: 26 %
Lymphs Abs: 2.1 10*3/uL (ref 0.7–4.0)
MCH: 29.2 pg (ref 26.0–34.0)
MCHC: 33.9 g/dL (ref 30.0–36.0)
MCV: 86 fL (ref 80.0–100.0)
Monocytes Absolute: 0.4 10*3/uL (ref 0.1–1.0)
Monocytes Relative: 5 %
Neutro Abs: 5.6 10*3/uL (ref 1.7–7.7)
Neutrophils Relative %: 69 %
Platelets: 179 10*3/uL (ref 150–400)
RBC: 4.8 MIL/uL (ref 3.87–5.11)
RDW: 13.1 % (ref 11.5–15.5)
WBC: 8.2 10*3/uL (ref 4.0–10.5)
nRBC: 0 % (ref 0.0–0.2)

## 2021-06-06 LAB — BASIC METABOLIC PANEL
Anion gap: 8 (ref 5–15)
BUN: 11 mg/dL (ref 6–20)
CO2: 22 mmol/L (ref 22–32)
Calcium: 8.8 mg/dL — ABNORMAL LOW (ref 8.9–10.3)
Chloride: 105 mmol/L (ref 98–111)
Creatinine, Ser: 0.59 mg/dL (ref 0.44–1.00)
GFR, Estimated: >60 mL/min (ref >60)
Glucose, Bld: 107 mg/dL — ABNORMAL HIGH (ref 70–99)
Potassium: 3.4 mmol/L — ABNORMAL LOW (ref 3.5–5.1)
Sodium: 135 mmol/L (ref 135–145)

## 2021-06-06 LAB — MAGNESIUM: Magnesium: 2.1 mg/dL (ref 1.7–2.4)

## 2021-06-06 LAB — TROPONIN I (HIGH SENSITIVITY): Troponin I (High Sensitivity): 2 ng/L (ref <18)

## 2021-06-06 MED ORDER — POTASSIUM CHLORIDE CRYS ER 20 MEQ PO TBCR
20.0000 meq | EXTENDED_RELEASE_TABLET | Freq: Once | ORAL | Status: AC
  Administered 2021-06-06: 20 meq via ORAL
  Filled 2021-06-06: qty 1

## 2021-06-06 NOTE — ED Provider Notes (Signed)
[facility] EMERGENCY DEPARTMENT Provider Note   CSN: [id number] Arrival date & time: 06/05/21  2042     History  Chief Complaint  Patient presents with   Palpitations    Carmen West is a 46 y.o. female.  Pt is a 46 yo female with PMH of hypothyroidism presenting for palpitations. States she started having palpitations x 2 minutes last night. States she used to have palpitations lasting approx 5 minutes and improving approx once a day. Hx of hypothyroidism compliant with medications. Denies chest pain or sob.   The history is provided by the patient. No language interpreter was used.  Palpitations Associated symptoms: no back pain, no chest pain, no cough, no shortness of breath and no vomiting       Home Medications Prior to Admission medications   Medication Sig Start Date End Date Taking? Authorizing Provider  levothyroxine (SYNTHROID, LEVOTHROID) 88 MCG tablet Take 100 mcg by mouth daily.   Yes [provider]  propranolol (INDERAL) 20 MG tablet Take 1 tablet (20 mg total) by mouth as needed. Take as needed for palpitations 04/25/21 05/25/21  Wayland Denis, MD      Allergies    Patient has no known allergies.    Review of Systems   Review of Systems  Constitutional:  Negative for chills and fever.  HENT:  Negative for ear pain and sore throat.   Eyes:  Negative for pain and visual disturbance.  Respiratory:  Negative for cough and shortness of breath.   Cardiovascular:  Positive for palpitations. Negative for chest pain.  Gastrointestinal:  Negative for abdominal pain and vomiting.  Genitourinary:  Negative for dysuria and hematuria.  Musculoskeletal:  Negative for arthralgias and back pain.  Skin:  Negative for color change and rash.  Neurological:  Negative for seizures and syncope.  All other systems reviewed and are negative.  Physical Exam Updated Vital Signs BP 117/72    Pulse 65    Resp 16    Ht 5' (1.524 m)    Wt 66.5 kg    LMP  05/12/2021    SpO2 99%    BMI 28.63 kg/m  Physical Exam Vitals and nursing note reviewed.  Constitutional:      General: She is not in acute distress.    Appearance: She is well-developed.  HENT:     Head: Normocephalic and atraumatic.  Eyes:     Conjunctiva/sclera: Conjunctivae normal.  Cardiovascular:     Rate and Rhythm: Normal rate and regular rhythm.     Heart sounds: No murmur heard. Pulmonary:     Effort: Pulmonary effort is normal. No respiratory distress.     Breath sounds: Normal breath sounds.  Abdominal:     Palpations: Abdomen is soft.     Tenderness: There is no abdominal tenderness.  Musculoskeletal:        General: No swelling.     Cervical back: Neck supple.  Skin:    General: Skin is warm and dry.     Capillary Refill: Capillary refill takes less than 2 seconds.  Neurological:     Mental Status: She is alert.  Psychiatric:        Mood and Affect: Mood normal.    ED Results / Procedures / Treatments   Labs (all labs ordered are listed, but only abnormal results are displayed) Labs Reviewed  BASIC METABOLIC PANEL - Abnormal; Notable for the following components:      Result Value   Potassium 3.4 (*)  Glucose, Bld 107 (*)    Calcium 8.8 (*)    All other components within normal limits  URINALYSIS, ROUTINE W REFLEX MICROSCOPIC  CBC WITH DIFFERENTIAL/PLATELET  TSH  MAGNESIUM  TROPONIN I (HIGH SENSITIVITY)  TROPONIN I (HIGH SENSITIVITY)    EKG EKG Interpretation  Date/Time:  Tuesday June 05 2021 20:51:52 EST Ventricular Rate:  100 PR Interval:  122 QRS Duration: 80 QT Interval:  330 QTC Calculation: 425 R Axis:   40 Text Interpretation: Normal sinus rhythm Nonspecific ST abnormality Abnormal ECG When compared with ECG of 23-Apr-2021 18:50, PREVIOUS ECG IS PRESENT Confirmed by Campbell Stall ([id number]) on [id number] 1:42:42 AM  Radiology No results found.  Procedures Procedures    Medications Ordered in ED Medications  potassium chloride  SA (KLOR-CON M) CR tablet 20 mEq (20 mEq Oral Given 06/06/21 0356)    ED Course/ Medical Decision Making/ A&P                             46 yo female with PMH of hypothyroidism presenting for palpitations. Pt is Aox3, no acute distress, afebrile, with stable vitals.   The patient's palpitations is not suggestive of pulmonary embolus, cardiac ischemia, aortic dissection, pericarditis, myocarditis, pulmonary embolism, pneumothorax, pneumonia, or esophageal perforation, or other serious etiology.    EKG nonspecific for ischemia/infarction. No dysrhythmias, brugada, WPW, prolonged QT noted. CXR reviewed and WNL. Troponin negative x2. TSH within normal limits. Doubt hyperthyroid secondary to medications. Labs without demonstration of acute pathology unless otherwise noted above.   Patient in no distress and overall condition improved here in the ED. Detailed discussions were had with the patient regarding current findings, and need for close f/u with PCP or on call doctor. The patient has been instructed to return immediately if the symptoms worsen in any way for re-evaluation. Patient verbalized understanding and is in agreement with current care plan. All questions answered prior to discharge.         Final Clinical Impression(s) / ED Diagnoses Final diagnoses:  Palpitations    Rx / DC Orders ED Discharge Orders     None         Lianne Cure, DO [id number]

## 2021-06-06 NOTE — Discharge Instructions (Signed)
Laboratory studies today demonstrated stable thyroid function.  Potassium was on the low side of normal so we gave a potassium supplement. All other electrolytes were within normal limits.   Cardiovascular workup was stable.  Please follow up with cardiologist provided if symptoms continue in the next 2 weeks.

## 2021-06-06 NOTE — ED Notes (Signed)
Written and verbal inst to pt  Verbalized an understanding  To home  

## 2021-07-02 ENCOUNTER — Encounter: Payer: 59 | Attending: Internal Medicine | Admitting: Registered"

## 2021-07-02 ENCOUNTER — Other Ambulatory Visit: Payer: Self-pay

## 2021-07-02 ENCOUNTER — Encounter: Payer: Self-pay | Admitting: Registered"

## 2021-07-02 VITALS — Ht 60.63 in | Wt 145.9 lb

## 2021-07-02 DIAGNOSIS — E663 Overweight: Secondary | ICD-10-CM | POA: Insufficient documentation

## 2021-07-02 NOTE — Progress Notes (Signed)
Medical Nutrition Therapy  ?Appointment Start time:  1000  Appointment End time:  1100 ? ?Primary concerns today: Wants to know if she is eating right and what is affecting her weight gain  ?Referral diagnosis: E66.3 (ICD-10-CM) - Overweight (BMI 25.0-29.9) ?Preferred learning style: no preference indicated ?Learning readiness: contemplating, ready ? ?NUTRITION ASSESSMENT  ? ?Anthropometrics  ?Wt Readings from Last 3 Encounters:  ?07/02/21 145 lb 14.4 oz (66.2 kg)  ?06/05/21 146 lb 9.7 oz (66.5 kg)  ?04/25/21 146 lb 11.2 oz (66.5 kg)  ? ?Clinical ?Medical Hx: hypothyroidism,  ?Medications: Thyroid,  ?Labs: A1c 5.5% 08/08/20 ?Notable Signs/Symptoms: weight gain in past 2 yrs, noticed accumulating in the abdominal area for the first time last year. ? ?Lifestyle & Dietary Hx ?Patient states she gained weight in stomach area last year.  ?Potential reasons for abdominal weight gain: ?Insulin resistance: Pt states there is no immediate family history of diabetes, but does have an uncle with diabetes.  ?Stress: Patient states she has panic attacks, pt states she is adjusting to being alone often, is new to [place] and learning the culture as well as trying to help her teenage daughter adjust. ?Pt states she has started going to [employer] class which she enjoys. ? ?Pt reports she was sedentary, now walking 2x/week. ? ?Pt states in April she will be starting Ramadan but will still incorporate some of what she learned in today's visit. Pt reports a typical meal to break fast at sundown would be 4-5 dates, milk and soup followed by a pasta meal, but she usually doesn't eat the pasta meal. However, does get hungry again and will eat around 10 pm. ? ?RD suggested Pt talk to her PCP to see if it would be beneficial to  get an updated Lipid panel. Pt states her PCP is not concerned about her last blood test that showed an elevated LDL (163 on 03/16/20 LDL) and PCP did not recommend doing another test.  ? ?Pt had question  about glucose labs, reassured patient that they appear WNL. ? ?Pt states she will be going out of the country June and July, but would like to come back in after that. ? ?Estimated daily fluid intake: ~24-36 oz water ?Supplements: multi vitamin, vitamin C ?Sleep: 7-9 hrs, wakes up every 1-2 hours to use bathroom, check daughter to cover her.  ?Stress / self-care: walk ?Current average weekly physical activity: walking 30 min, tries 2x/week ? ?24-Hr Dietary Recall ?First Meal: 1 egg, coffee with milk, 1-2 toast, sour cream ?Snack: fruit, nuts ?Second Meal: (3:30-4 pm) always salad with pasta OR tuna OR sandwich OR lentils OR couscous with vegetables. ?Snack: yogurt ?Third Meal:  ?Snack: 16 oz 2% milk ?Beverages: water, coffee, milk ? ?NUTRITION DIAGNOSIS  ?NI-5.8.5 Inadeqate fiber intake As related to limited whole grain, maybe vegetables.  As evidenced by dietary recall. ? ? ?NUTRITION INTERVENTION  ?Nutrition education (E-1) on the following topics:  ?Types of fiber ?MyPlate ?Things associated with abdominal weight gain ? ?Handouts Provided Include  ?Fiber ?MyPlate placemat ?Using Plate method ? ?Learning Style & Readiness for Change ?Teaching method utilized: Visual & Auditory  ?Demonstrated degree of understanding via: Teach Back  ?Barriers to learning/adherence to lifestyle change: none ? ?Goals Established by Pt ?150 min per week / 3-5 times per week 30-45 min of moderate activity. ?Find activity you enjoy. ? ?Continue to eat slow, chew food well and pay attention to hunger/fullness cues ?Use handouts for ideas to create balanced meals. Continue  using whole grains and legumes such as lentils. ? ?After Ramadan consider having 3 structured meals, less milk at night, more water. ? ?Continue to work on stress management ? ?MONITORING & EVALUATION ?Dietary intake, weekly physical activity, and weight in prn. ? ?Next Steps  ?Patient is to work on goals and return for follow-up. ?

## 2021-07-02 NOTE — Patient Instructions (Addendum)
150 min per week / 3-5 times per week 30-45 min of moderate activity. ?Find activity you enjoy. ? ?Continue to eat slow, chew food well and pay attention to hunger/fullness cues ?Use handouts for ideas to create balanced meals. Continue using whole grains and legumes such as lentils. ? ?After Ramadan consider having 3 structured meals, less milk at night, more water. ? ?Continue to work on stress management ?

## 2021-10-07 ENCOUNTER — Encounter: Payer: Self-pay | Admitting: *Deleted

## 2022-10-18 ENCOUNTER — Other Ambulatory Visit: Payer: Self-pay

## 2022-10-18 DIAGNOSIS — E039 Hypothyroidism, unspecified: Secondary | ICD-10-CM

## 2022-10-30 ENCOUNTER — Other Ambulatory Visit: Payer: Medicaid Other

## 2022-10-30 DIAGNOSIS — E039 Hypothyroidism, unspecified: Secondary | ICD-10-CM | POA: Diagnosis not present

## 2022-10-30 LAB — TSH: TSH: 3.13 u[IU]/mL (ref 0.35–5.50)

## 2022-10-30 LAB — T4, FREE: Free T4: 0.83 ng/dL (ref 0.60–1.60)

## 2022-11-06 ENCOUNTER — Ambulatory Visit (INDEPENDENT_AMBULATORY_CARE_PROVIDER_SITE_OTHER): Payer: Medicaid Other | Admitting: "Endocrinology

## 2022-11-06 ENCOUNTER — Encounter: Payer: Self-pay | Admitting: "Endocrinology

## 2022-11-06 VITALS — BP 120/75 | HR 73 | Ht 60.0 in | Wt 145.6 lb

## 2022-11-06 DIAGNOSIS — E039 Hypothyroidism, unspecified: Secondary | ICD-10-CM

## 2022-11-06 DIAGNOSIS — E782 Mixed hyperlipidemia: Secondary | ICD-10-CM

## 2022-11-06 MED ORDER — LEVOTHYROXINE SODIUM 100 MCG PO CAPS: 100.0000 ug | ORAL_CAPSULE | Freq: Every day | ORAL | 2 refills | Status: AC

## 2022-11-06 NOTE — Progress Notes (Signed)
Outpatient Endocrinology Note Carmen Katy, MD  11/06/22   [facility] 1948/02/25 [id number]  Referring Provider: Vania Rea, FNP Primary Care Provider: Olegario Messier, MD Subjective  Chief Complaint  Patient presents with   Hypothyroidism    Assessment & Plan  Carmen West was seen today for hypothyroidism.  Diagnoses and all orders for this visit:  Acquired hypothyroidism -     TSH Rfx on Abnormal to Free T4; Future  Mixed hypercholesterolemia and hypertriglyceridemia  Other orders -     Levothyroxine Sodium 100 MCG CAPS; Take 1 capsule (100 mcg total) by mouth daily.   Carmen West is currently taking levothyroxine 100 mcg qd. Patient is currently biochemically euthyroid.  Educated on thyroid axis.  Recommend the following: Take levothyroxine 100 mcg every morning.  Advised to take levothyroxine first thing in the morning on empty stomach and wait at least 30 minutes to 1 hour before eating or drinking anything or taking any other medications. Space out levothyroxine by 4 hours from any acid reflux medication/fibrate/iron/calcium/multivitamin. Advised to take birth control pills and nutritional supplements in the evening. Repeat lab before next visit or sooner if symptoms of hyperthyroidism or hypothyroidism develop.  Notify us immediately in case of pregnancy/breastfeeding or significant weight gain or loss. Counseled on compliance and follow up needs.  Recommend lifestyle changes and cutting down low fat-low carb diet Patient's diet is heavy in red meat Recommend cutting down on red meat and avoiding refined/processed/outside food Sleep 7-9 hours/day, adapt good sleep hygiene Adapt de-stressing and relaxation techniques to prevent stress induced weight gain Avoid/switch medications that lead to weight gain by discussing with the prescribing physician   I have reviewed current medications, nurse's notes, allergies, vital signs, past medical and  surgical history, family medical history, and social history for this encounter. Counseled patient on symptoms, examination findings, lab findings, imaging results, treatment decisions and monitoring and prognosis. The patient understood the recommendations and agrees with the treatment plan. All questions regarding treatment plan were fully answered.  Return in about 6 months (around 05/09/2023) for visit + labs before next visit.   Carmen Juncos, MD  11/06/22   I have reviewed current medications, nurse's notes, allergies, vital signs, past medical and surgical history, family medical history, and social history for this encounter. Counseled patient on symptoms, examination findings, lab findings, imaging results, treatment decisions and monitoring and prognosis. The patient understood the recommendations and agrees with the treatment plan. All questions regarding treatment plan were fully answered.   History of Present Illness Carmen West is a 47 y.o. year old female who presents to our clinic with hypothyroidism diagnosed in 2011.    Patient is from [place].  Symptoms suggestive of HYPOTHYROIDISM:  fatigue Yes, much improved after iron   weight gain No cold intolerance  No constipation  No  Symptoms suggestive of HYPERTHYROIDISM:  weight loss  No heat intolerance No hyperdefecation  No palpitations  No  Compressive symptoms:  dysphagia  No dysphonia  Yes positional dyspnea (especially with simultaneous arms elevation)  No  Smokes  No On biotin  No Personal history of head/neck surgery/irradiation  No  08/30/22-reviewed labs by PCP Chol 201 Tg 153 HDL 52 LDL 118 VLDL 31  Physical Exam  BP 409/81   Pulse 73   Ht 5' (1.524 m)   Wt 145 lb 9.6 oz (66 kg)   SpO2 99%   BMI 28.44 kg/m  Constitutional: well developed, well nourished Head: normocephalic,  atraumatic, no exophthalmos Eyes: sclera anicteric, no redness Neck: no thyromegaly, no thyroid tenderness;  no nodules palpated Lungs: normal respiratory effort Neurology: alert and oriented, no fine hand tremor Skin: dry, no appreciable rashes Musculoskeletal: no appreciable defects Psychiatric: normal mood and affect  Allergies No Known Allergies  Current Medications Patient's Medications  New Prescriptions   No medications on file  Previous Medications   FERROUS SULFATE 324 MG TBEC    Take 324 mg by mouth daily with breakfast.   MULTIPLE VITAMIN (MULTIVITAMIN) TABLET    Take 1 tablet by mouth daily.   VITAMIN D, ERGOCALCIFEROL, (DRISDOL) 1.25 MG (50000 UNIT) CAPS CAPSULE    Take 50,000 Units by mouth every 7 (seven) days.  Modified Medications   Modified Medication Previous Medication   LEVOTHYROXINE SODIUM 100 MCG CAPS Levothyroxine Sodium 100 MCG CAPS      Take 1 capsule (100 mcg total) by mouth daily.    Take 100 mcg by mouth daily.  Discontinued Medications   PROPRANOLOL (INDERAL) 20 MG TABLET    Take 1 tablet (20 mg total) by mouth as needed. Take as needed for palpitations    Past Medical History Past Medical History:  Diagnosis Date   Anxiety    some   Depression    AFTER LOSS OF BABY;  NO MEDS   Headache, migraine    IMPROVED   Hyperlipidemia    Hypothyroidism 2009   DR [facility]   Pregnant     Past Surgical History Past Surgical History:  Procedure Laterality Date   CESAREAN SECTION  2013,2008,2012   lost child(2012)   CESAREAN SECTION  03/25/2012   Procedure: CESAREAN SECTION;  Surgeon: Michael Litter, MD;  Location: [facility];  Service: Obstetrics;  Laterality: N/A;  Repeat Cesarean Section Delivery Girl @ 0158, Apgars 7/9    Family History family history includes Arthritis in her mother; Atrial fibrillation in her mother; Birth defects in her cousin and daughter; CVA in her father; Congenital heart disease in her daughter; Diabetes in her maternal uncle; Heart disease in her mother; Hypertension in her father and mother; Hypothyroidism in her mother and  sister.  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, commissioning in [employer]  Tobacco Use   Smoking status: Never   Smokeless tobacco: Never  Vaping Use   Vaping status: Never Used  Substance and Sexual Activity   Alcohol use: No   Drug use: No   Sexual activity: Yes    Partners: Male    Birth control/protection: None  Other Topics Concern   Not on file  Social History Narrative   Not on file   Social Determinants of Health   Financial Resource Strain: Not on file  Food Insecurity: Not on file  Transportation Needs: Not on file  Physical Activity: Not on file  Stress: Not on file  Social Connections: Not on file  Intimate Partner Violence: Not on file    Laboratory Investigations Lab Results  Component Value Date   TSH 3.13 10/30/2022   TSH 1.313 06/06/2021   TSH 1.818 04/23/2021   FREET4 0.83 10/30/2022   FREET4 1.22 03/25/2012   FREET4 0.97 01/20/2012     No results found for: "TSI"  No components found for: "TRAB"   Lab Results  Component Value Date   CHOL 236 (H) 03/16/2020   Lab Results  Component Value Date   HDL 54 03/16/2020   Lab Results  Component Value Date   LDLCALC 163 (H) 03/16/2020   Lab Results  Component Value Date   TRIG 105 03/16/2020   Lab Results  Component Value Date   CHOLHDL 4.4 03/16/2020   Lab Results  Component Value Date   CREATININE 0.59 06/06/2021   No results found for: "GFR"    Component Value Date/Time   NA 135 06/06/2021 0140   NA 138 03/16/2020 1030   K 3.4 (L) 06/06/2021 0140   CL 105 06/06/2021 0140   CO2 22 06/06/2021 0140   GLUCOSE 107 (H) 06/06/2021 0140   GLUCOSE 91 12/25/2011 0850   BUN 11 06/06/2021 0140   BUN 10 03/16/2020 1030   CREATININE 0.59  06/06/2021 0140   CALCIUM 8.8 (L) 06/06/2021 0140   PROT 6.9 03/16/2020 1030   ALBUMIN 4.3 03/16/2020 1030   AST 22 03/16/2020 1030   ALT 22 01/28/2020 1213   ALKPHOS 58 03/16/2020 1030   BILITOT 0.5 03/16/2020 1030   GFRNONAA >60 06/06/2021 0140   GFRAA 123 03/16/2020 1030      Latest Ref Rng & Units 06/06/2021    1:40 AM 04/23/2021    7:22 PM 03/16/2020   10:30 AM  BMP  Glucose 70 - 99 mg/dL 875  643  98   BUN 6 - 20 mg/dL 11  13  10    Creatinine 0.44 - 1.00 mg/dL 3.29  5.18  8.41   BUN/Creat Ratio 9 - 23   15   Sodium 135 - 145 mmol/L 135  137  138   Potassium 3.5 - 5.1 mmol/L 3.4  3.7  4.1   Chloride 98 - 111 mmol/L 105  104  102   CO2 22 - 32 mmol/L 22  24    Calcium 8.9 - 10.3 mg/dL 8.8  9.4  9.1        Component Value Date/Time   WBC 8.2 06/06/2021 0140   RBC 4.80 06/06/2021 0140   HGB 14.0 06/06/2021 0140   HGB 14.3 03/16/2020 1030   HCT 41.3 06/06/2021 0140   HCT 42.8 03/16/2020 1030   PLT 179 06/06/2021 0140   PLT 202 03/16/2020 1030   MCV 86.0 06/06/2021 0140   MCV 89 03/16/2020 1030   MCH 29.2 06/06/2021 0140   MCHC 33.9 06/06/2021 0140   RDW 13.1 06/06/2021 0140   RDW 12.5 03/16/2020 1030   LYMPHSABS 2.1 06/06/2021 0140   LYMPHSABS 1.6 03/16/2020 1030   MONOABS 0.4 06/06/2021 0140   EOSABS 0.0 06/06/2021 0140   EOSABS 0.0 03/16/2020 1030   BASOSABS 0.0 06/06/2021 0140   BASOSABS 0.0 03/16/2020 1030      Parts of this note may have been dictated using voice recognition software. There may be variances in spelling and vocabulary which are unintentional. Not all errors are proofread. Please notify the Thereasa Parkin if any discrepancies are noted or if the meaning of any statement is not clear.

## 2023-03-25 ENCOUNTER — Ambulatory Visit: Payer: Medicaid Other | Admitting: Physical Therapy

## 2023-03-27 ENCOUNTER — Ambulatory Visit: Payer: Medicaid Other | Admitting: Physical Therapy

## 2023-04-02 NOTE — Therapy (Signed)
OUTPATIENT PHYSICAL THERAPY LOWER EXTREMITY EVALUATION  Patient Name: Carmen West MRN: [id number] DOB:March 05, 1976, 47 y.o., female Today's Date: 04/03/2023   PT End of Session - 04/03/23 1325     Visit Number 1    Number of Visits --   1-2x/week   Date for PT Re-Evaluation 05/29/23    Authorization Type [employer] MCD - FOTO    PT Start Time 1140    PT Stop Time 1215    PT Time Calculation (min) 35 min             Past Medical History:  Diagnosis Date   Anxiety    some   Depression    AFTER LOSS OF BABY;  NO MEDS   Headache, migraine    IMPROVED   Hyperlipidemia    Hypothyroidism 2009   DR [facility]   Pregnant    Past Surgical History:  Procedure Laterality Date   CESAREAN SECTION  2013,2008,2012   lost child(2012)   CESAREAN SECTION  03/25/2012   Procedure: CESAREAN SECTION;  Surgeon: Michael Litter, MD;  Location: [facility];  Service: Obstetrics;  Laterality: N/A;  Repeat Cesarean Section Delivery Girl @ 0158, Apgars 7/9   Patient Active Problem List   Diagnosis Date Noted   Overweight (BMI 25.0-29.9) 04/26/2021   White coat syndrome without diagnosis of hypertension 04/25/2021   Chronic pain of right knee 04/25/2021   Hyperlipidemia 06/21/2020   Palpitations 02/10/2012   Hypothyroidism 11/10/2011   Family history of congenital heart defect 08/22/2011    PCP: Olegario Messier, MD  REFERRING PROVIDER: Joen Laura, MD  THERAPY DIAG:  Chronic pain of both knees - Plan: PT plan of care cert/re-cert  Muscle weakness - Plan: PT plan of care cert/re-cert  REFERRING DIAG: bil knee pain  Rationale for Evaluation and Treatment:  Rehabilitation  SUBJECTIVE:  PERTINENT PAST HISTORY:  Bil knee pain      PRECAUTIONS: None  WEIGHT BEARING RESTRICTIONS No  FALLS:  Has patient fallen in last 6 months? No, Number of falls: 0  MOI/History of condition:  Onset date: 6 years  SUBJECTIVE STATEMENT  Carmen West is a 47 y.o. female who presents to  clinic with chief complaint of bil knee pain which started about 6 years ago.  Slow onset and slowly worsening.  She has difficulty going up and down stairs and getting out of her car.  She feels like the her knee's may give out.  She reports she may have pes anserine bursitis.   Red flags:  denies   Pain:  Are you having pain? Yes Pain location: medial and diffuse bil knee pain NPRS scale:  0/10 to 7/10 Aggravating factors: standing, steps Relieving factors: rest Pain description: sharp and aching Stage: Chronic 24 hour pattern: worse with activity   Occupation: NA  Assistive Device: NA  Hand Dominance: NA  Patient Goals/Specific Activities: reduce   OBJECTIVE:   DIAGNOSTIC FINDINGS:  X-ray, unable to find in chart  GENERAL OBSERVATION/GAIT: Valgus alignment of knees  SENSATION: Light touch: Appears intact  PALPATION: TTP bil pes anserine, valgus alignment  MUSCLE LENGTH: Hamstrings: Right no restriction; Left no restriction Maisie Fus test: Right no restriction; Left no restriction Ely's test: Right no restriction; Left no restriction  LE MMT:  MMT Right (Eval) Left (Eval)  Hip flexion (L2, L3)    Knee extension (L3) 4+ 4+  Knee flexion 4+ 4+  Hip abduction 4+ 4+  Hip extension 4+ 4+  Hip external rotation  Hip internal rotation    Hip adduction    Ankle dorsiflexion (L4)    Ankle plantarflexion (S1)    Ankle inversion    Ankle eversion    Great Toe ext (L5)    Grossly     (Blank rows = not tested, score listed is out of 5 possible points.  N = WNL, D = diminished, C = clear for gross weakness with myotome testing, * = concordant pain with testing)  LE ROM:  ROM Right (Eval) Left (Eval)  Hip flexion    Hip extension    Hip abduction    Hip adduction    Hip internal rotation    Hip external rotation    Knee extension n n  Knee flexion n n  Ankle dorsiflexion    Ankle plantarflexion    Ankle inversion    Ankle eversion     (Blank rows  = not tested, N = WNL, * = concordant pain with testing)  Functional Tests  Eval                                                              SPECIAL TESTS:  (-) instability testing,    PATIENT SURVEYS:  FOTO 51 -> 64   TODAY'S TREATMENT: Therapeutic Exercise: Creating, reviewing, and completing below HEP   PATIENT EDUCATION:  POC, diagnosis, prognosis, HEP, and outcome measures.  Pt educated via explanation, demonstration, and handout (HEP).  Pt confirms understanding verbally.   HOME EXERCISE PROGRAM: Access Code: [email or web address] URL: [email or web address] Date: 04/03/2023 Prepared by: Alphonzo Severance  Exercises - Active Straight Leg Raise with Quad Set  - 1 x daily - 7 x weekly - 3 sets - 10 reps - Seated Knee Extension with Resistance  - 1 x daily - 7 x weekly - 3 sets - 10 reps - Supine Bridge with Knee Extension and Pelvic Floor Contraction  - 1 x daily - 4-5 x weekly - 3 sets - 5 reps - Clam with Resistance  - 1 x daily - 4-5 x weekly - 3 sets - 10 reps  Treatment priorities   Eval        Pes anserine?         Quad and HS strengthening        Eccentric quad control        Look at balance PRN                  ASSESSMENT:  CLINICAL IMPRESSION: [name] is a 47 y.o. female who presents to clinic with signs and sxs consistent with chronic bil knee pain.  PT arrives late so objective is limited.  She does have pain over the pes anserine L>R but also has more diffuse knee pain.  May have overlapping general weakness w/ some element of pes anserine bursitis.  Reported "giving out" is not consistent with pes anserine pathology.  She is very active but does not do any isolated strength training for the knees.  Pt will benefit from skilled PT to address relevant deficits and improve comfort with stairs and transfers    OBJECTIVE IMPAIRMENTS: Pain, knee and hip strength  ACTIVITY LIMITATIONS: stairs, transfers, walking, standing  PERSONAL FACTORS:  See medical history and pertinent history   REHAB POTENTIAL: Good  CLINICAL  DECISION MAKING: Stable/uncomplicated  EVALUATION COMPLEXITY: Low   GOALS:   SHORT TERM GOALS: Target date: 05/01/2023   Adysson will be >75% HEP compliant to improve carryover between sessions and facilitate independent management of condition  Evaluation: ongoing Goal status: INITIAL   LONG TERM GOALS: Target date: 05/29/2023   Phinley will improve FOTO score to 64 as a proxy for functional improvement  Evaluation/Baseline: 51 Goal status: INITIAL    2.  Anamaria will self report >/= 50% decrease in pain from evaluation to improve function in daily tasks  Evaluation/Baseline: 7/10 max pain Goal status: INITIAL   3.  Tilla will be able to navigate 30 steps using reciprocal pattern, to improve community ambulation  Evaluation/Baseline: unable Goal status: INITIAL   4.  Kensley will be able to participate in desired recreation as well as feel comfortable with knee strengthening options at gym, not limited by pain  Evaluation/Baseline: limited Goal status: INITIAL   5.  Shernita will report confidence in self management of condition at time of discharge with advanced HEP  Evaluation/Baseline: unable to self manage Goal status: INITIAL   PLAN: PT FREQUENCY: 1-2x/week  PT DURATION: 8 weeks  PLANNED INTERVENTIONS:  [id number]- PT Re-evaluation, 97110-Therapeutic exercises, 97530- Therapeutic activity, 97112- Neuromuscular re-education, 97535- Self Care, 69629- Manual therapy, [id number]- Gait training, [id number]- Aquatic Therapy, [id number]- Electrical stimulation (manual), [id number]- Vasopneumatic device, [id number]- Traction (mechanical), [id number]- Ionotophoresis 4mg /ml Dexamethasone, Taping, Dry Needling, Joint manipulation, and Spinal manipulation.   Alphonzo Severance PT, DPT 04/03/2023, 1:29 PM

## 2023-04-03 ENCOUNTER — Encounter: Payer: Self-pay | Admitting: Physical Therapy

## 2023-04-03 ENCOUNTER — Other Ambulatory Visit: Payer: Self-pay

## 2023-04-03 ENCOUNTER — Ambulatory Visit: Payer: Medicaid Other | Attending: Orthopedic Surgery | Admitting: Physical Therapy

## 2023-04-03 DIAGNOSIS — M25562 Pain in left knee: Secondary | ICD-10-CM | POA: Insufficient documentation

## 2023-04-03 DIAGNOSIS — M6281 Muscle weakness (generalized): Secondary | ICD-10-CM | POA: Diagnosis present

## 2023-04-03 DIAGNOSIS — M25561 Pain in right knee: Secondary | ICD-10-CM | POA: Insufficient documentation

## 2023-04-03 DIAGNOSIS — G8929 Other chronic pain: Secondary | ICD-10-CM | POA: Insufficient documentation

## 2023-04-23 ENCOUNTER — Telehealth: Payer: Self-pay

## 2023-04-23 ENCOUNTER — Other Ambulatory Visit: Payer: Self-pay

## 2023-04-23 ENCOUNTER — Ambulatory Visit: Payer: Medicaid Other | Attending: Orthopedic Surgery

## 2023-04-23 DIAGNOSIS — E039 Hypothyroidism, unspecified: Secondary | ICD-10-CM

## 2023-04-23 NOTE — Telephone Encounter (Signed)
 LVM regarding missed appointment, confirmed next appointment time.  1st no-show  Berta Minor, [place] 04/23/23 11:56 AM

## 2023-04-29 ENCOUNTER — Ambulatory Visit: Payer: Medicaid Other | Admitting: Physical Therapy

## 2023-05-02 ENCOUNTER — Other Ambulatory Visit: Payer: Medicaid Other

## 2023-05-03 LAB — TSH+FREE T4: TSH W/REFLEX TO FT4: 1.53 m[IU]/L

## 2023-05-06 ENCOUNTER — Ambulatory Visit: Payer: Medicaid Other | Admitting: Physical Therapy

## 2023-05-09 ENCOUNTER — Ambulatory Visit: Payer: Medicaid Other | Admitting: "Endocrinology

## 2023-05-13 ENCOUNTER — Ambulatory Visit: Payer: Medicaid Other | Admitting: "Endocrinology

## 2023-05-26 ENCOUNTER — Ambulatory Visit (INDEPENDENT_AMBULATORY_CARE_PROVIDER_SITE_OTHER): Payer: Medicaid Other | Admitting: "Endocrinology

## 2023-05-26 ENCOUNTER — Encounter: Payer: Self-pay | Admitting: "Endocrinology

## 2023-05-26 VITALS — BP 116/80 | HR 85 | Resp 16 | Ht 60.0 in | Wt 145.8 lb

## 2023-05-26 DIAGNOSIS — E663 Overweight: Secondary | ICD-10-CM

## 2023-05-26 DIAGNOSIS — E7800 Pure hypercholesterolemia, unspecified: Secondary | ICD-10-CM

## 2023-05-26 DIAGNOSIS — E039 Hypothyroidism, unspecified: Secondary | ICD-10-CM | POA: Diagnosis not present

## 2023-05-26 DIAGNOSIS — F439 Reaction to severe stress, unspecified: Secondary | ICD-10-CM

## 2023-05-26 NOTE — Progress Notes (Signed)
 Outpatient Endocrinology Note Jorge Newcomer, MD  05/26/23   Maizy [facility] [birth year] [id number]  Referring Provider: Nooruddin, Saad, MD Primary Care Provider: Nooruddin, Saad, MD Subjective  No chief complaint on file.   Assessment & Plan  Diagnoses and all orders for this visit:  Acquired hypothyroidism  Pure hypercholesterolemia  Overweight  Stress    Carmen West is currently taking levothyroxine  100 mcg qd. Patient is currently biochemically euthyroid.  Educated on thyroid  axis.  Recommend the following: Take levothyroxine  100 mcg every morning.  Advised to take levothyroxine  first thing in the morning on empty stomach and wait at least 30 minutes to 1 hour before eating or drinking anything or taking any other medications. Space out levothyroxine  by 4 hours from any acid reflux medication/fibrate/iron/calcium/multivitamin. Advised to take birth control pills and nutritional supplements in the evening. Repeat lab before next visit or sooner if symptoms of hyperthyroidism or hypothyroidism develop.  Notify us  immediately in case of pregnancy/breastfeeding or significant weight gain or loss. Counseled on compliance and follow up needs.  Recommend lifestyle changes and cutting down low fat-low carb diet Patient's diet was heavy in red meat Recommend cutting down on red meat and avoiding refined/processed/outside food Sleep 7-9 hours/day, adapt good sleep hygiene Adapt de-stressing and relaxation techniques to prevent stress induced weight gain Avoid/switch medications that lead to weight gain by discussing with the prescribing physician   Patient is really concerned about her high cholesterol Discussed thyroid  role with lipids Last LDL at 165 at 04/2023 with PCP Advised lifestyle changes Almonds, cashews, [place] nuts, hazelnuts, and pistachios are nuts that can increase HDL cholesterol No cardiac disease/stroke in self/family  Patient is not interested in  medication   Patient reports a lot of stress related to personal situation Teary during encounter  Provided counseling and motivation   I have reviewed current medications, nurse's notes, allergies, vital signs, past medical and surgical history, family medical history, and social history for this encounter. Counseled patient on symptoms, examination findings, lab findings, imaging results, treatment decisions and monitoring and prognosis. The patient understood the recommendations and agrees with the treatment plan. All questions regarding treatment plan were fully answered.  Return in about 3 months (around 08/23/2023) for visit + labs before next visit.   Jorge Newcomer, MD  05/26/23   I have reviewed current medications, nurse's notes, allergies, vital signs, past medical and surgical history, family medical history, and social history for this encounter. Counseled patient on symptoms, examination findings, lab findings, imaging results, treatment decisions and monitoring and prognosis. The patient understood the recommendations and agrees with the treatment plan. All questions regarding treatment plan were fully answered.   History of Present Illness Carmen West is a 48 y.o. year old female who presents to our clinic with hypothyroidism diagnosed in 2011.    Patient is from [place].  Symptoms suggestive of HYPOTHYROIDISM:  fatigue Yes, much improved after iron   weight gain No cold intolerance  No constipation  No  Symptoms suggestive of HYPERTHYROIDISM:  weight loss  No heat intolerance No hyperdefecation  No palpitations  No  Compressive symptoms:  dysphagia  No dysphonia  Yes positional dyspnea (especially with simultaneous arms elevation)  No  Smokes  No On biotin  No Personal history of head/neck surgery/irradiation  No  08/30/22-reviewed labs by PCP Chol 201 Tg 153 HDL 52 LDL 118 VLDL 31  05/09/23 Chol 244 LDL 165 HDL 50 VLDL 29 Tg  147  Physical  Exam  BP 116/80   Pulse 85   Resp 16   Ht 5' (1.524 m)   Wt 145 lb 12.8 oz (66.1 kg)   SpO2 98%   BMI 28.47 kg/m  Constitutional: well developed, well nourished Head: normocephalic, atraumatic, no exophthalmos Eyes: sclera anicteric, no redness Neck: no thyromegaly, no thyroid  tenderness; no nodules palpated Lungs: normal respiratory effort Neurology: alert and oriented, no fine hand tremor Skin: dry, no appreciable rashes Musculoskeletal: no appreciable defects Psychiatric: normal mood and affect  Allergies No Known Allergies  Current Medications Patient's Medications  New Prescriptions   No medications on file  Previous Medications   FERROUS SULFATE  324 MG TBEC    Take 324 mg by mouth daily with breakfast.   LEVOTHYROXINE  SODIUM 100 MCG CAPS    Take 1 capsule (100 mcg total) by mouth daily.   MULTIPLE VITAMIN (MULTIVITAMIN) TABLET    Take 1 tablet by mouth daily.   OMEGA-3 FATTY ACIDS (FISH OIL) 300 MG CAPS    Take by mouth.   VITAMIN D, ERGOCALCIFEROL, (DRISDOL) 1.25 MG (50000 UNIT) CAPS CAPSULE    Take 50,000 Units by mouth every 7 (seven) days.  Modified Medications   No medications on file  Discontinued Medications   No medications on file    Past Medical History Past Medical History:  Diagnosis Date   Anxiety    some   Depression    AFTER LOSS OF BABY;  NO MEDS   Headache, migraine    IMPROVED   Hyperlipidemia    Hypothyroidism 2009   DR [facility]   Pregnant     Past Surgical History Past Surgical History:  Procedure Laterality Date   CESAREAN SECTION  2013,2008,2012   lost child(2012)   CESAREAN SECTION  03/25/2012   Procedure: CESAREAN SECTION;  Surgeon: Norville Beery, MD;  Location: [facility];  Service: Obstetrics;  Laterality: N/A;  Repeat Cesarean Section Delivery Girl @ 0158, Apgars 7/9    Family History family history includes Arthritis in her mother; Atrial fibrillation in her mother; Birth defects in her cousin and daughter;  CVA in her father; Congenital heart disease in her daughter; Diabetes in her maternal uncle; Heart disease in her mother; Hypertension in her father and mother; Hypothyroidism in her mother and sister.  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, commissioning in [employer]  Tobacco Use   Smoking status: Never   Smokeless tobacco: Never  Vaping Use   Vaping status: Never Used  Substance and Sexual Activity   Alcohol use: No   Drug use: No   Sexual activity: Yes    Partners: Male    Birth control/protection: None  Other Topics Concern   Not on file  Social History Narrative   Not on file   Social Drivers of Health   Financial Resource Strain: Not on file  Food Insecurity: Not on file  Transportation Needs: Not on file  Physical Activity: Not on file  Stress: Not on file  Social Connections: Not on file  Intimate Partner Violence: Not on file    Laboratory Investigations Lab Results  Component Value Date   TSH 3.13 10/30/2022   TSH 1.313 06/06/2021   TSH 1.818 04/23/2021  FREET4 0.83 10/30/2022   FREET4 1.22 03/25/2012   FREET4 0.97 01/20/2012     No results found for: "TSI"   No components found for: "TRAB"   Lab Results  Component Value Date   CHOL 236 (H) 03/16/2020   Lab Results  Component Value Date   HDL 54 03/16/2020   Lab Results  Component Value Date   LDLCALC 163 (H) 03/16/2020   Lab Results  Component Value Date   TRIG 105 03/16/2020   Lab Results  Component Value Date   CHOLHDL 4.4 03/16/2020   Lab Results  Component Value Date   CREATININE 0.59 06/06/2021   No results found for: "GFR"    Component Value Date/Time   NA 135 06/06/2021 0140   NA 138 03/16/2020 1030   K 3.4 (L) 06/06/2021 0140   CL  105 06/06/2021 0140   CO2 22 06/06/2021 0140   GLUCOSE 107 (H) 06/06/2021 0140   GLUCOSE 91 12/25/2011 0850   BUN 11 06/06/2021 0140   BUN 10 03/16/2020 1030   CREATININE 0.59 06/06/2021 0140   CALCIUM 8.8 (L) 06/06/2021 0140   PROT 6.9 03/16/2020 1030   ALBUMIN 4.3 03/16/2020 1030   AST 22 03/16/2020 1030   ALT 22 01/28/2020 1213   ALKPHOS 58 03/16/2020 1030   BILITOT 0.5 03/16/2020 1030   GFRNONAA >60 06/06/2021 0140   GFRAA 123 03/16/2020 1030      Latest Ref Rng & Units 06/06/2021    1:40 AM 04/23/2021    7:22 PM 03/16/2020   10:30 AM  BMP  Glucose 70 - 99 mg/dL 161  096  98   BUN 6 - 20 mg/dL 11  13  10    Creatinine 0.44 - 1.00 mg/dL 0.45  4.09  8.11   BUN/Creat Ratio 9 - 23   15   Sodium 135 - 145 mmol/L 135  137  138   Potassium 3.5 - 5.1 mmol/L 3.4  3.7  4.1   Chloride 98 - 111 mmol/L 105  104  102   CO2 22 - 32 mmol/L 22  24    Calcium 8.9 - 10.3 mg/dL 8.8  9.4  9.1        Component Value Date/Time   WBC 8.2 06/06/2021 0140   RBC 4.80 06/06/2021 0140   HGB 14.0 06/06/2021 0140   HGB 14.3 03/16/2020 1030   HCT 41.3 06/06/2021 0140   HCT 42.8 03/16/2020 1030   PLT 179 06/06/2021 0140   PLT 202 03/16/2020 1030   MCV 86.0 06/06/2021 0140   MCV 89 03/16/2020 1030   MCH 29.2 06/06/2021 0140   MCHC 33.9 06/06/2021 0140   RDW 13.1 06/06/2021 0140   RDW 12.5 03/16/2020 1030   LYMPHSABS 2.1 06/06/2021 0140   LYMPHSABS 1.6 03/16/2020 1030   MONOABS 0.4 06/06/2021 0140   EOSABS 0.0 06/06/2021 0140   EOSABS 0.0 03/16/2020 1030   BASOSABS 0.0 06/06/2021 0140   BASOSABS 0.0 03/16/2020 1030      Parts of this note may have been dictated using voice recognition software. There may be variances in spelling and vocabulary which are unintentional. Not all errors are proofread. Please notify the [facility] if any discrepancies are noted or if the meaning of any statement is not clear.

## 2023-05-26 NOTE — Patient Instructions (Addendum)
 Almonds, cashews, [place] nuts, hazelnuts, and pistachios are nuts that can increase HDL cholesterol.   Recommend cutting down on red meat and avoiding refined/processed/outside food Sleep 7-9 hours/day, adapt good sleep hygiene Adapt de-stressing and relaxation techniques to prevent stress induced weight gain Avoid/switch medications that lead to weight gain by discussing with the prescribing physician

## 2023-08-19 ENCOUNTER — Other Ambulatory Visit: Payer: Medicaid Other

## 2023-08-20 LAB — COLOGUARD: COLOGUARD: NEGATIVE

## 2023-08-22 ENCOUNTER — Ambulatory Visit: Payer: Medicaid Other | Admitting: "Endocrinology

## 2023-08-26 ENCOUNTER — Ambulatory Visit: Payer: Medicaid Other | Admitting: "Endocrinology

## 2024-01-28 ENCOUNTER — Ambulatory Visit (INDEPENDENT_AMBULATORY_CARE_PROVIDER_SITE_OTHER): Admitting: Orthopedic Surgery

## 2024-01-28 ENCOUNTER — Other Ambulatory Visit (INDEPENDENT_AMBULATORY_CARE_PROVIDER_SITE_OTHER): Payer: Self-pay

## 2024-01-28 VITALS — BP 125/79 | HR 94 | Ht 60.0 in | Wt 149.8 lb

## 2024-01-28 DIAGNOSIS — M545 Low back pain, unspecified: Secondary | ICD-10-CM

## 2024-01-28 NOTE — Progress Notes (Signed)
 Orthopedic Spine Surgery Office Note  Assessment: Patient is a 48 y.o. female with chronic lower back pain in the area of L5/S1. 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 Discussed that the conservative treatments include:  -activity modification  -physical therapy  -over the counter pain medications  -medrol dosepak  -lumbar steroid injections -Patient has tried none  -Recommended PT. Referral provided to her -If she is not doing any better after 6 weeks of PT, would recommend an MRI of the lumbar spine to evaluate further -Patient should return to office on an as needed basis  Patient expressed understanding of the plan and all questions were answered to the patient's satisfaction.   ___________________________________________________________________________   History:  Patient is a 48 y.o. female who presents today for lumbar spine.  Patient has had several years of low back pain.  She feels that around the belt line.  She has no pain radiating into either lower extremity.  She said that it started after giving birth to her third daughter which was about 12 years ago.  She notes the pain when she is trying to lay flat.  She otherwise does not really notice the pain.  She has not tried any other treatments recently for this pain.   Weakness: denies Symptoms of imbalance: denies Paresthesias and numbness: denies Bowel or bladder incontinence: denies  Saddle anesthesia: denies  Treatments tried: none  Review of systems: Denies fevers and chills, night sweats, unexplained weight loss, history of cancer, pain that wakes them at night  Past medical history: HLD Hypothyroidism  Allergies: NKDA  Past surgical history:  C-section x 3  Social history: Denies use of nicotine product (smoking, vaping, patches, smokeless) Alcohol use:  denies Denies recreational drug use   Physical Exam:  BMI of 29.3  General: no acute distress, appears stated age Neurologic: alert, answering questions appropriately, following commands Respiratory: unlabored breathing on room air, symmetric chest rise Psychiatric: appropriate affect, normal cadence to speech   MSK (spine):  -Strength exam      Left  Right EHL    5/5  5/5 TA    5/5  5/5 GSC    5/5  5/5 Knee extension  5/5  5/5 Hip flexion   5/5  5/5  -Sensory exam    Sensation intact to light touch in L3-S1 nerve distributions of bilateral lower extremities  -Achilles DTR: 2/4 on the left, 2/4 on the right -Patellar tendon DTR: 2/4 on the left, 2/4 on the right  -Straight leg raise: negative bilaterally -Clonus: no beats bilaterally  -Left hip exam: No pain through range of motion, negative SI joint compression test, negative FABER, negative Gaenslen's -Right hip exam: No pain through range of motion, negative SI joint compression test, negative FABER, negative Gaenslen's  Imaging: XRs of the lumbar spine from 01/28/2024 were independently reviewed and interpreted, showing no significant degenerative changes.  No evidence of instability on flexion/extension view.  No fracture or dislocation seen.  Possible pars defect seen on the flexion view at L5/S1.    Patient name: Carmen West Patient MRN: [id number] Date of visit: 01/28/24

## 2024-02-11 ENCOUNTER — Ambulatory Visit: Attending: Orthopedic Surgery

## 2024-02-11 ENCOUNTER — Other Ambulatory Visit: Payer: Self-pay

## 2024-02-11 DIAGNOSIS — G8929 Other chronic pain: Secondary | ICD-10-CM | POA: Insufficient documentation

## 2024-02-11 DIAGNOSIS — M545 Low back pain, unspecified: Secondary | ICD-10-CM | POA: Diagnosis present

## 2024-02-11 DIAGNOSIS — M6281 Muscle weakness (generalized): Secondary | ICD-10-CM | POA: Insufficient documentation

## 2024-02-11 NOTE — Therapy (Signed)
 OUTPATIENT PHYSICAL THERAPY THORACOLUMBAR EVALUATION   Patient Name: Carmen West MRN: [id number] DOB:Jun 14, 1975, 48 y.o., female Today's Date: 02/11/2024  END OF SESSION:  PT End of Session - 02/11/24 1250     Visit Number 1    Number of Visits 16    Date for Recertification  04/12/24    Authorization Type [place] MEDICAID [employer]    PT Start Time 1150   pt arrived late   PT Stop Time 1230    PT Time Calculation (min) 40 min    Activity Tolerance Patient tolerated treatment well          Past Medical History:  Diagnosis Date   Anxiety    some   Depression    AFTER LOSS OF BABY;  NO MEDS   Headache, migraine    IMPROVED   Hyperlipidemia    Hypothyroidism 2009   DR [facility]   Pregnant    Past Surgical History:  Procedure Laterality Date   CESAREAN SECTION  2013,2008,2012   lost child(2012)   CESAREAN SECTION  03/25/2012   Procedure: CESAREAN SECTION;  Surgeon: Ovid DELENA All, MD;  Location: [facility];  Service: Obstetrics;  Laterality: N/A;  Repeat Cesarean Section Delivery Girl @ 0158, Apgars 7/9   Patient Active Problem List   Diagnosis Date Noted   Overweight (BMI 25.0-29.9) 04/26/2021   White coat syndrome without diagnosis of hypertension 04/25/2021   Chronic pain of right knee 04/25/2021   Hyperlipidemia 06/21/2020   Palpitations 02/10/2012   Hypothyroidism 11/10/2011   Family history of congenital heart defect 08/22/2011    PCP: Nooruddin, Saad, MD PCP - General   REFERRING PROVIDER: Georgina Ozell DELENA, MD Ref Provider   REFERRING DIAG: M54.50 (ICD-10-CM) - Lumbar pain   Rationale for Evaluation and Treatment: rehabilitation  THERAPY DIAG:  Chronic midline low back pain without sciatica  Muscle weakness  ONSET DATE: 3 Years ago  SUBJECTIVE:                                                                                                                                                                                           SUBJECTIVE  STATEMENT: Can't lay back with legs straight. Started about 3 years ago. Unable to carry things that are moderately heavy and feel pain when I get up suddenly from chairs.  PERTINENT HISTORY:  N/a  PAIN:  0C, 6W Are you having pain? Yes: NPRS scale: 10 Pain location: LB Pain description: dull achy, sharp with movement Aggravating factors: laying down Relieving factors: n/a  PRECAUTIONS: None  RED FLAGS: None   WEIGHT BEARING RESTRICTIONS: No  FALLS:  Has patient  fallen in last 6 months? No   OCCUPATION: editor, commissioning  PLOF: Independent  PATIENT GOALS: decrease pain, improve ability to lay down   OBJECTIVE:  Note: Objective measures were completed at Evaluation unless otherwise noted.   PATIENT SURVEYS:  ODI: 3/50  COGNITION: Overall cognitive status: Within functional limits for tasks assessed     SENSATION: WFL   POSTURE: No Significant postural limitations, rounded shoulders, forward head, and increased lumbar lordosis  PALPATION: TTP lumbar spine central and BL TTP lower thoracic spine central  LUMBAR ROM:   AROM eval  Flexion Wfl all no pain  Extension   Right lateral flexion   Left lateral flexion   Right rotation   Left rotation    (Blank rows = not tested)  LOWER EXTREMITY ROM:     Active  Right eval Left eval  Hip flexion WfL all hip movements WFL all hip movements  Hip extension    Hip abduction    Hip adduction    Hip internal rotation    Hip external rotation    Knee flexion    Knee extension    Ankle dorsiflexion    Ankle plantarflexion    Ankle inversion    Ankle eversion     (Blank rows = not tested)  LOWER EXTREMITY MMT:    MMT Right eval Left eval  Hip flexion 4- 4-  Hip extension    Hip abduction 4- 4-  Hip adduction 4- 4-  Hip internal rotation    Hip external rotation    Knee flexion 4 4  Knee extension 4 4  Ankle dorsiflexion    Ankle plantarflexion    Ankle inversion    Ankle eversion     (Blank  rows = not tested)       TREATMENT DATE:                                                                                                                               TREATMENT 02/11/2024:  Therapeutic Exercise: Supine LTR x8x3s Supine PPT x8x3s Supine glute bridge with PPT x8x3s    Self-care/Home Management: Patient educated on HEP, POC, prognosis, and relevant tissues/anatomy.      PATIENT EDUCATION:  Education details: HEP Person educated: Patient Education method: Solicitor, Actor cues, Verbal cues, and Handouts Education comprehension: verbalized understanding and returned demonstration  HOME EXERCISE PROGRAM: 5x/wk, 2x/day, 2x8x3s Supine LTR x8x3s Supine PPT x8x3s Supine glute bridge with PPT x8x3s  ASSESSMENT:  CLINICAL IMPRESSION: EVAL: Patient is a 48 year old female who presents with chronic LBP. Patient presents with deficits in: excessive pain, lying intolerance, functional weakness of lower body and core. As a result, the patient would benefit from skilled PT to address aforementioned deficits via plan below.    OBJECTIVE IMPAIRMENTS: decreased strength, improper body mechanics, postural dysfunction, obesity, and pain.   ACTIVITY LIMITATIONS: carrying, lifting, and sitting  PERSONAL FACTORS: Fitness and Time since onset of  injury/illness/exacerbation are also affecting patient's functional outcome.   REHAB POTENTIAL: Good  CLINICAL DECISION MAKING: Stable/uncomplicated  EVALUATION COMPLEXITY: Moderate   GOALS: Goals reviewed with patient? No  SHORT TERM GOALS: Target date: 03/03/2024   1) Patient will demonstrate 75% HEP compliance to show independence with self-management of condition   Baseline: 0% Goal status: INITIAL  2) Patient will decrease worst pain to 4 at most to improve ADL completion and overall QOL   Baseline: 6 Goal status: INITIAL    LONG TERM GOALS: Target date: 04/12/24   1) Patient will  demonstrate 100% HEP compliance to show independence with self-management of condition   Baseline: 0% Goal status: INITIAL  2) Patient will decrease worst pain to 2 at most to improve ADL completion and overall QOL   Baseline: 6 Goal status: INITIAL  3) Patient will demonstrate a 1 point improvement in ODI to show improvements in ADL completion and overall QOL    Baseline: 3/50 Goal status: INITIAL  4) Patient will be able to lay flat on back with at least 90% capacity with no significant increases in pain to demonstrate improvements in lying tolerance, pain levels, and overall QOL    Baseline: 25% Goal status: INITIAL     PLAN:  PT FREQUENCY: 1-2x/week  PT DURATION: 8 weeks  PLANNED INTERVENTIONS: 97110-Therapeutic exercises, 97530- Therapeutic activity, [id number]- Neuromuscular re-education, 97535- Self Care, [id number]- Manual therapy, and Patient/Family education.  PLAN FOR NEXT SESSION: HEP assessment and progression, symptom modulation, and loading (isolated and/or functional). Manual therapy, aerobic, gait, and NME training as needed.     Washington Greener Wisdom Seybold  PT, DPT  02/11/2024, 1:01 PM  For all possible CPT codes, reference the Planned Interventions line above.     Check all conditions that are expected to impact treatment: {Conditions expected to impact treatment:Musculoskeletal disorders   If treatment provided at initial evaluation, no treatment charged due to lack of authorization.

## 2024-02-16 ENCOUNTER — Encounter: Payer: Self-pay | Admitting: Radiology

## 2024-02-18 ENCOUNTER — Ambulatory Visit: Attending: Orthopedic Surgery

## 2024-02-18 DIAGNOSIS — M25561 Pain in right knee: Secondary | ICD-10-CM | POA: Insufficient documentation

## 2024-02-18 DIAGNOSIS — M545 Low back pain, unspecified: Secondary | ICD-10-CM | POA: Insufficient documentation

## 2024-02-18 DIAGNOSIS — M25562 Pain in left knee: Secondary | ICD-10-CM | POA: Insufficient documentation

## 2024-02-18 DIAGNOSIS — M6281 Muscle weakness (generalized): Secondary | ICD-10-CM | POA: Insufficient documentation

## 2024-02-18 DIAGNOSIS — G8929 Other chronic pain: Secondary | ICD-10-CM | POA: Diagnosis present

## 2024-02-18 NOTE — Therapy (Signed)
 OUTPATIENT PHYSICAL THERAPY THORACOLUMBAR EVALUATION   Patient Name: Carmen West MRN: [id number] DOB:1975-11-15, 48 y.o., female Today's Date: 02/18/2024  END OF SESSION:  PT End of Session - 02/18/24 1121     Visit Number 2    Number of Visits 16    Date for Recertification  04/12/24    Authorization Type [place] MEDICA[employer]    PT Start Time 1045   pt arrived late   PT Stop Time 1110    PT Time Calculation (min) 25 min    Activity Tolerance Patient tolerated treatment well           Past Medical History:  Diagnosis Date   Anxiety    some   Depression    AFTER LOSS OF BABY;  NO MEDS   Headache, migraine    IMPROVED   Hyperlipidemia    Hypothyroidism 2009   DR JAEJALI   Pregnant    Past Surgical History:  Procedure Laterality Date   CESAREAN SECTION  2013,2008,2012   lost child(2012)   CESAREAN SECTION  03/25/2012   Procedure: CESAREAN SECTION;  Surgeon: Ovid DELENA All, MD;  Location: [facility];  Service: Obstetrics;  Laterality: N/A;  Repeat Cesarean Section Delivery Girl @ 0158, Apgars 7/9   Patient Active Problem List   Diagnosis Date Noted   Overweight (BMI 25.0-29.9) 04/26/2021   White coat syndrome without diagnosis of hypertension 04/25/2021   Chronic pain of right knee 04/25/2021   Hyperlipidemia 06/21/2020   Palpitations 02/10/2012   Hypothyroidism 11/10/2011   Family history of congenital heart defect 08/22/2011    PCP: Nelia Dirks, MD PCP - General   REFERRING PROVIDER: Georgina Ozell DELENA, MD Ref Provider   REFERRING DIAG: M54.50 (ICD-10-CM) - Lumbar pain   Rationale for Evaluation and Treatment: rehabilitation  THERAPY DIAG:  Muscle weakness  Chronic midline low back pain without sciatica  Chronic pain of both knees  ONSET DATE: 3 Years ago  SUBJECTIVE:                                                                                                                                                                                            SUBJECTIVE STATEMENT: Doing alright after last session. Had pretty bad back cramps after being bent over and doing daughters hair.   Can't lay back with legs straight. Started about 3 years ago. Unable to carry things that are moderately heavy and feel pain when I get up suddenly from chairs.  PERTINENT HISTORY:  N/a  PAIN:  0C, 6W Are you having pain? Yes: NPRS scale: 10 Pain location: LB Pain description: dull achy, sharp  with movement Aggravating factors: laying down Relieving factors: n/a  PRECAUTIONS: None  RED FLAGS: None   WEIGHT BEARING RESTRICTIONS: No  FALLS:  Has patient fallen in last 6 months? No   OCCUPATION: editor, commissioning  PLOF: Independent  PATIENT GOALS: decrease pain, improve ability to lay down   OBJECTIVE:  Note: Objective measures were completed at Evaluation unless otherwise noted.   PATIENT SURVEYS:  ODI: 3/50  COGNITION: Overall cognitive status: Within functional limits for tasks assessed     SENSATION: WFL   POSTURE: No Significant postural limitations, rounded shoulders, forward head, and increased lumbar lordosis  PALPATION: TTP lumbar spine central and BL TTP lower thoracic spine central  LUMBAR ROM:   AROM eval  Flexion Wfl all no pain  Extension   Right lateral flexion   Left lateral flexion   Right rotation   Left rotation    (Blank rows = not tested)  LOWER EXTREMITY ROM:     Active  Right eval Left eval  Hip flexion WfL all hip movements WFL all hip movements  Hip extension    Hip abduction    Hip adduction    Hip internal rotation    Hip external rotation    Knee flexion    Knee extension    Ankle dorsiflexion    Ankle plantarflexion    Ankle inversion    Ankle eversion     (Blank rows = not tested)  LOWER EXTREMITY MMT:    MMT Right eval Left eval  Hip flexion 4- 4-  Hip extension    Hip abduction 4- 4-  Hip adduction 4- 4-  Hip internal rotation    Hip external rotation    Knee  flexion 4 4  Knee extension 4 4  Ankle dorsiflexion    Ankle plantarflexion    Ankle inversion    Ankle eversion     (Blank rows = not tested)       TREATMENT DATE:  TREATMENT 02/18/2024:  Therapeutic Exercise: HEP reassessment and update Supine LTR x8x3s Supine PPT x8x3s Supine glute bridge x8x3s Supine open book x5x3s BL   Self-care/Home Management: Patient educated on HEP, POC, prognosis, and relevant tissues/anatomy.                                                                                                                                   TREATMENT 02/11/2024:  Therapeutic Exercise: Supine LTR x8x3s Supine PPT x8x3s Supine glute bridge with PPT x8x3s    Self-care/Home Management: Patient educated on HEP, POC, prognosis, and relevant tissues/anatomy.      PATIENT EDUCATION:  Education details: HEP Person educated: Patient Education method: Solicitor, Actor cues, Verbal cues, and Handouts Education comprehension: verbalized understanding and returned demonstration  HOME EXERCISE PROGRAM: 5x/wk, 2x/day, 2x8x3s Supine LTR x8x3s Supine PPT x8x3s Supine glute bridge with PPT x8x3s  ASSESSMENT:  CLINICAL IMPRESSION:  Patient tolerated treatment with no increases in pain  with progressions in lumbar ROM and core loading. Current deficits include: activity tolerance, core strength, BL hip strength, and excessive pain. As a result, patient would continue to benefit from skilled PT to address said deficits via plan below.   EVAL: Patient is a 48 year old female who presents with chronic LBP. Patient presents with deficits in: excessive pain, lying intolerance, functional weakness of lower body and core. As a result, the patient would benefit from skilled PT to address aforementioned deficits via plan below.    OBJECTIVE IMPAIRMENTS: decreased strength, improper body mechanics, postural dysfunction, obesity, and pain.   ACTIVITY  LIMITATIONS: carrying, lifting, and sitting  PERSONAL FACTORS: Fitness and Time since onset of injury/illness/exacerbation are also affecting patient's functional outcome.   REHAB POTENTIAL: Good  CLINICAL DECISION MAKING: Stable/uncomplicated  EVALUATION COMPLEXITY: Moderate   GOALS: Goals reviewed with patient? No  SHORT TERM GOALS: Target date: 03/03/2024   1) Patient will demonstrate 75% HEP compliance to show independence with self-management of condition   Baseline: 0% Goal status: INITIAL  2) Patient will decrease worst pain to 4 at most to improve ADL completion and overall QOL   Baseline: 6 Goal status: INITIAL    LONG TERM GOALS: Target date: 04/12/24   1) Patient will demonstrate 100% HEP compliance to show independence with self-management of condition   Baseline: 0% Goal status: INITIAL  2) Patient will decrease worst pain to 2 at most to improve ADL completion and overall QOL   Baseline: 6 Goal status: INITIAL  3) Patient will demonstrate a 1 point improvement in ODI to show improvements in ADL completion and overall QOL    Baseline: 3/50 Goal status: INITIAL  4) Patient will be able to lay flat on back with at least 90% capacity with no significant increases in pain to demonstrate improvements in lying tolerance, pain levels, and overall QOL    Baseline: 25% Goal status: INITIAL     PLAN:  PT FREQUENCY: 1-2x/week  PT DURATION: 8 weeks  PLANNED INTERVENTIONS: 97110-Therapeutic exercises, 97530- Therapeutic activity, [id number]- Neuromuscular re-education, 97535- Self Care, [id number]- Manual therapy, and Patient/Family education.  PLAN FOR NEXT SESSION: HEP assessment and progression, symptom modulation, and loading (isolated and/or functional). Manual therapy, aerobic, gait, and NME training as needed.     Washington Greener Skylinn Vialpando  PT, DPT  02/18/2024, 11:27 AM  For all possible CPT codes, reference the Planned Interventions line above.      Check all conditions that are expected to impact treatment: {Conditions expected to impact treatment:Musculoskeletal disorders   If treatment provided at initial evaluation, no treatment charged due to lack of authorization.

## 2024-02-26 ENCOUNTER — Ambulatory Visit

## 2024-02-26 DIAGNOSIS — M6281 Muscle weakness (generalized): Secondary | ICD-10-CM

## 2024-02-26 DIAGNOSIS — G8929 Other chronic pain: Secondary | ICD-10-CM

## 2024-02-26 NOTE — Therapy (Signed)
 OUTPATIENT PHYSICAL THERAPY THORACOLUMBAR EVALUATION   Patient Name: Carmen West MRN: [id number] DOB:[birth year], 48 y.o., female Today's Date: 02/26/2024  END OF SESSION:  PT End of Session - 02/26/24 1636     Visit Number 3    Number of Visits 16    Date for Recertification  04/12/24    Authorization Type [place] MEDICAID U[employer]    PT Start Time 1615    PT Stop Time 1700    PT Time Calculation (min) 45 min    Activity Tolerance Patient tolerated treatment well            Past Medical History:  Diagnosis Date   Anxiety    some   Depression    AFTER LOSS OF BABY;  NO MEDS   Headache, migraine    IMPROVED   Hyperlipidemia    Hypothyroidism 2009   DR JAEJALI   Pregnant    Past Surgical History:  Procedure Laterality Date   CESAREAN SECTION  2013,2008,2012   lost child(2012)   CESAREAN SECTION  03/25/2012   Procedure: CESAREAN SECTION;  Surgeon: Ovid DELENA All, MD;  Location: [facility];  Service: Obstetrics;  Laterality: N/A;  Repeat Cesarean Section Delivery Girl @ 0158, Apgars 7/9   Patient Active Problem List   Diagnosis Date Noted   Overweight (BMI 25.0-29.9) 04/26/2021   White coat syndrome without diagnosis of hypertension 04/25/2021   Chronic pain of right knee 04/25/2021   Hyperlipidemia 06/21/2020   Palpitations 02/10/2012   Hypothyroidism 11/10/2011   Family history of congenital heart defect 08/22/2011    PCP: Nooruddin, Saad, MD PCP - General   REFERRING PROVIDER: Georgina Ozell DELENA, MD Ref Provider   REFERRING DIAG: M54.50 (ICD-10-CM) - Lumbar pain   Rationale for Evaluation and Treatment: rehabilitation  THERAPY DIAG:  Muscle weakness  Chronic midline low back pain without sciatica  ONSET DATE: 3 Years ago  SUBJECTIVE:                                                                                                                                                                                           SUBJECTIVE STATEMENT: Doing good  today. Pain is improving. Some pain with walking. Not doing HEP due to being stressed about everything lately.    Can't lay back with legs straight. Started about 3 years ago. Unable to carry things that are moderately heavy and feel pain when I get up suddenly from chairs.  PERTINENT HISTORY:  N/a  PAIN:  4C, 6W Are you having pain? Yes: NPRS scale: 10 Pain location: LB Pain description: dull achy, sharp with movement Aggravating factors: laying down  Relieving factors: n/a  PRECAUTIONS: None  RED FLAGS: None   WEIGHT BEARING RESTRICTIONS: No  FALLS:  Has patient fallen in last 6 months? No   OCCUPATION: editor, commissioning  PLOF: Independent  PATIENT GOALS: decrease pain, improve ability to lay down   OBJECTIVE:  Note: Objective measures were completed at Evaluation unless otherwise noted.   PATIENT SURVEYS:  ODI: 3/50  COGNITION: Overall cognitive status: Within functional limits for tasks assessed     SENSATION: WFL   POSTURE: No Significant postural limitations, rounded shoulders, forward head, and increased lumbar lordosis  PALPATION: TTP lumbar spine central and BL TTP lower thoracic spine central  LUMBAR ROM:   AROM eval  Flexion Wfl all no pain  Extension   Right lateral flexion   Left lateral flexion   Right rotation   Left rotation    (Blank rows = not tested)  LOWER EXTREMITY ROM:     Active  Right eval Left eval  Hip flexion WfL all hip movements WFL all hip movements  Hip extension    Hip abduction    Hip adduction    Hip internal rotation    Hip external rotation    Knee flexion    Knee extension    Ankle dorsiflexion    Ankle plantarflexion    Ankle inversion    Ankle eversion     (Blank rows = not tested)  LOWER EXTREMITY MMT:    MMT Right eval Left eval  Hip flexion 4- 4-  Hip extension    Hip abduction 4- 4-  Hip adduction 4- 4-  Hip internal rotation    Hip external rotation    Knee flexion 4 4  Knee  extension 4 4  Ankle dorsiflexion    Ankle plantarflexion    Ankle inversion    Ankle eversion     (Blank rows = not tested)       TREATMENT DATE:  02/26/24 Therapeutic Exercise: HEP reassessment and update Supine LTR 2x8x3s Supine PPT 2x8x3s Supine marches with PPT 2x8x3s Supine red TB clamshell 2x8x3s  Therapeutic Activity: Supine glute bridge 2x8x3s Seated lumbar flexion 2x8x3s Seated sidebend x8x3s    Did not do:  DL Bird dogs Suitcase carry   TREATMENT 02/18/2024:  Therapeutic Exercise: HEP reassessment and update Supine LTR x8x3s Supine PPT x8x3s Supine glute bridge x8x3s Supine open book x5x3s BL   Self-care/Home Management: Patient educated on HEP, POC, prognosis, and relevant tissues/anatomy.                                                                                                                                      PATIENT EDUCATION:  Education details: HEP Person educated: Patient Education method: Solicitor, Actor cues, Verbal cues, and Handouts Education comprehension: verbalized understanding and returned demonstration  HOME EXERCISE PROGRAM: 5x/wk, 2x/day, 2x8x3s Supine LTR x8x3s Supine PPT x8x3s Supine glute bridge with PPT x8x3s  ASSESSMENT:  CLINICAL IMPRESSION:  Patient tolerated treatment with no increases in pain with progressions in lumbar ROM, core, and BL hip loading. Current deficits include: activity tolerance, core strength, BL hip strength, and excessive pain. As a result, patient would continue to benefit from skilled PT to address said deficits via plan below.   EVAL: Patient is a 48 year old female who presents with chronic LBP. Patient presents with deficits in: excessive pain, lying intolerance, functional weakness of lower body and core. As a result, the patient would benefit from skilled PT to address aforementioned deficits via plan below.    OBJECTIVE IMPAIRMENTS: decreased strength,  improper body mechanics, postural dysfunction, obesity, and pain.   ACTIVITY LIMITATIONS: carrying, lifting, and sitting  PERSONAL FACTORS: Fitness and Time since onset of injury/illness/exacerbation are also affecting patient's functional outcome.   REHAB POTENTIAL: Good  CLINICAL DECISION MAKING: Stable/uncomplicated  EVALUATION COMPLEXITY: Moderate   GOALS: Goals reviewed with patient? No  SHORT TERM GOALS: Target date: 03/03/2024   1) Patient will demonstrate 75% HEP compliance to show independence with self-management of condition   Baseline: 0% Goal status: INITIAL  2) Patient will decrease worst pain to 4 at most to improve ADL completion and overall QOL   Baseline: 6 Goal status: INITIAL    LONG TERM GOALS: Target date: 04/12/24   1) Patient will demonstrate 100% HEP compliance to show independence with self-management of condition   Baseline: 0% Goal status: INITIAL  2) Patient will decrease worst pain to 2 at most to improve ADL completion and overall QOL   Baseline: 6 Goal status: INITIAL  3) Patient will demonstrate a 1 point improvement in ODI to show improvements in ADL completion and overall QOL    Baseline: 3/50 Goal status: INITIAL  4) Patient will be able to lay flat on back with at least 90% capacity with no significant increases in pain to demonstrate improvements in lying tolerance, pain levels, and overall QOL    Baseline: 25% Goal status: INITIAL     PLAN:  PT FREQUENCY: 1-2x/week  PT DURATION: 8 weeks  PLANNED INTERVENTIONS: 97110-Therapeutic exercises, 97530- Therapeutic activity, [id number]- Neuromuscular re-education, 97535- Self Care, [id number]- Manual therapy, and Patient/Family education.  PLAN FOR NEXT SESSION: HEP assessment and progression, symptom modulation, and loading (isolated and/or functional). Manual therapy, aerobic, gait, and NME training as needed. Core, LB, and glutes strengthening.     Washington Greener Blessyn [place]   PT, DPT  02/26/2024, 4:52 PM  For all possible CPT codes, reference the Planned Interventions line above.     Check all conditions that are expected to impact treatment: {Conditions expected to impact treatment:Musculoskeletal disorders   If treatment provided at initial evaluation, no treatment charged due to lack of authorization.

## 2024-03-03 ENCOUNTER — Ambulatory Visit

## 2024-03-03 DIAGNOSIS — M6281 Muscle weakness (generalized): Secondary | ICD-10-CM

## 2024-03-03 DIAGNOSIS — G8929 Other chronic pain: Secondary | ICD-10-CM

## 2024-03-03 NOTE — Therapy (Addendum)
 " OUTPATIENT PHYSICAL THERAPY THORACOLUMBAR EVALUATION PHYSICAL THERAPY UNPLANNED DISCHARGE SUMMARY   Visits from Start of Care: 4  Current functional level related to goals / functional outcomes: Current status unknown   Remaining deficits: Current status unknown   Education / Equipment: Pt has not returned since visit listed below  Patient goals were not assessed. Patient is being discharged due to not returning since the last visit.  (the note below was addended to include the above D/C summary on 05/12/24)   Patient Name: Carmen West MRN: [id number] DOB:03-31-1976, 48 y.o., female Today's Date: 03/03/2024  END OF SESSION:  PT End of Session - 03/03/24 1025     Visit Number 4    Number of Visits 16    Date for Recertification  04/12/24    Authorization Type [place] MEDICAID [employer]    PT Start Time 1020    PT Stop Time 1100    PT Time Calculation (min) 40 min    Activity Tolerance Patient tolerated treatment well             Past Medical History:  Diagnosis Date   Anxiety    some   Depression    AFTER LOSS OF BABY;  NO MEDS   Headache, migraine    IMPROVED   Hyperlipidemia    Hypothyroidism 2009   DR JAEJALI   Pregnant    Past Surgical History:  Procedure Laterality Date   CESAREAN SECTION  2013,2008,2012   lost child(2012)   CESAREAN SECTION  03/25/2012   Procedure: CESAREAN SECTION;  Surgeon: Ovid DELENA All, MD;  Location: [facility];  Service: Obstetrics;  Laterality: N/A;  Repeat Cesarean Section Delivery Girl @ 0158, Apgars 7/9   Patient Active Problem List   Diagnosis Date Noted   Overweight (BMI 25.0-29.9) 04/26/2021   White coat syndrome without diagnosis of hypertension 04/25/2021   Chronic pain of right knee 04/25/2021   Hyperlipidemia 06/21/2020   Palpitations 02/10/2012   Hypothyroidism 11/10/2011   Family history of congenital heart defect 08/22/2011    PCP: Nooruddin, Saad, MD PCP - General   REFERRING PROVIDER: Georgina Ozell DELENA,  MD Ref Provider   REFERRING DIAG: M54.50 (ICD-10-CM) - Lumbar pain   Rationale for Evaluation and Treatment: rehabilitation  THERAPY DIAG:  Muscle weakness  Chronic midline low back pain without sciatica  ONSET DATE: 3 Years ago  SUBJECTIVE:                                                                                                                                                                                           SUBJECTIVE STATEMENT: Doing good today. No  pain today. Not doing HEP due to being stressed about everything lately.    Can't lay back with legs straight. Started about 3 years ago. Unable to carry things that are moderately heavy and feel pain when I get up suddenly from chairs.  PERTINENT HISTORY:  N/a  PAIN:  4C, 6W Are you having pain? Yes: NPRS scale: 10 Pain location: LB Pain description: dull achy, sharp with movement Aggravating factors: laying down Relieving factors: n/a  PRECAUTIONS: None  RED FLAGS: None   WEIGHT BEARING RESTRICTIONS: No  FALLS:  Has patient fallen in last 6 months? No   OCCUPATION: editor, commissioning  PLOF: Independent  PATIENT GOALS: decrease pain, improve ability to lay down   OBJECTIVE:  Note: Objective measures were completed at Evaluation unless otherwise noted.   PATIENT SURVEYS:  ODI: 3/50  COGNITION: Overall cognitive status: Within functional limits for tasks assessed     SENSATION: WFL   POSTURE: No Significant postural limitations, rounded shoulders, forward head, and increased lumbar lordosis  PALPATION: TTP lumbar spine central and BL TTP lower thoracic spine central  LUMBAR ROM:   AROM eval  Flexion Wfl all no pain  Extension   Right lateral flexion   Left lateral flexion   Right rotation   Left rotation    (Blank rows = not tested)  LOWER EXTREMITY ROM:     Active  Right eval Left eval  Hip flexion WfL all hip movements WFL all hip movements  Hip extension    Hip  abduction    Hip adduction    Hip internal rotation    Hip external rotation    Knee flexion    Knee extension    Ankle dorsiflexion    Ankle plantarflexion    Ankle inversion    Ankle eversion     (Blank rows = not tested)  LOWER EXTREMITY MMT:    MMT Right eval Left eval  Hip flexion 4- 4-  Hip extension    Hip abduction 4- 4-  Hip adduction 4- 4-  Hip internal rotation    Hip external rotation    Knee flexion 4 4  Knee extension 4 4  Ankle dorsiflexion    Ankle plantarflexion    Ankle inversion    Ankle eversion     (Blank rows = not tested)       TREATMENT DATE:  03/03/24 Therapeutic Exercise: HEP reassessment and update Supine LTR 2x8x3s Seated hip march x8x3s BL, 2x8x3s RTB Seated red TB clamshell 2x8x3s   Therapeutic Activity: Supine glute bridge x8x3s SL bridge 2x6x3s BL 5# deadlift x8 (heavy cuing on hinging and keeping core braced)  Did not do: Bird dog Seated lumbar flexion 2x8x3s Seated sidebend x8x3s       02/26/24 Therapeutic Exercise: HEP reassessment and update Supine LTR 2x8x3s Supine PPT 2x8x3s Supine marches with PPT 2x8x3s Supine red TB clamshell 2x8x3s  Therapeutic Activity: Supine glute bridge 2x8x3s Seated lumbar flexion 2x8x3s Seated sidebend x8x3s    Did not do:  DL Bird dogs Suitcase carry   TREATMENT 02/18/2024:  Therapeutic Exercise: HEP reassessment and update Supine LTR x8x3s Supine PPT x8x3s Supine glute bridge x8x3s Supine open book x5x3s BL   Self-care/Home Management: Patient educated on HEP, POC, prognosis, and relevant tissues/anatomy.  PATIENT EDUCATION:  Education details: HEP Person educated: Patient Education method: Programmer, Multimedia, Facilities Manager, Actor cues, Verbal cues, and Handouts Education comprehension: verbalized understanding and returned  demonstration  HOME EXERCISE PROGRAM: 5x/wk, 2x/day, 2x8x3s Supine LTR x8x3s Supine PPT x8x3s Supine glute bridge with PPT x8x3s  ASSESSMENT:  CLINICAL IMPRESSION:  Patient tolerated treatment with no increases in pain with progressions in lumbar ROM, core, and BL hip loading. Current deficits include: activity tolerance, core strength, BL hip strength, and excessive pain. Requiring heavy cuing for learning hinge/deadlift technique. As a result, patient would continue to benefit from skilled PT to address said deficits via plan below.   EVAL: Patient is a 48 year old female who presents with chronic LBP. Patient presents with deficits in: excessive pain, lying intolerance, functional weakness of lower body and core. As a result, the patient would benefit from skilled PT to address aforementioned deficits via plan below.    OBJECTIVE IMPAIRMENTS: decreased strength, improper body mechanics, postural dysfunction, obesity, and pain.   ACTIVITY LIMITATIONS: carrying, lifting, and sitting  PERSONAL FACTORS: Fitness and Time since onset of injury/illness/exacerbation are also affecting patient's functional outcome.   REHAB POTENTIAL: Good  CLINICAL DECISION MAKING: Stable/uncomplicated  EVALUATION COMPLEXITY: Moderate   GOALS: Goals reviewed with patient? No  SHORT TERM GOALS: Target date: 03/03/2024   1) Patient will demonstrate 75% HEP compliance to show independence with self-management of condition   Baseline: 0% Goal status: INITIAL  2) Patient will decrease worst pain to 4 at most to improve ADL completion and overall QOL   Baseline: 6 Goal status: INITIAL    LONG TERM GOALS: Target date: 04/12/24   1) Patient will demonstrate 100% HEP compliance to show independence with self-management of condition   Baseline: 0% Goal status: INITIAL  2) Patient will decrease worst pain to 2 at most to improve ADL completion and overall QOL   Baseline: 6 Goal status:  INITIAL  3) Patient will demonstrate a 1 point improvement in ODI to show improvements in ADL completion and overall QOL    Baseline: 3/50 Goal status: INITIAL  4) Patient will be able to lay flat on back with at least 90% capacity with no significant increases in pain to demonstrate improvements in lying tolerance, pain levels, and overall QOL    Baseline: 25% Goal status: INITIAL     PLAN:  PT FREQUENCY: 1-2x/week  PT DURATION: 8 weeks  PLANNED INTERVENTIONS: 97110-Therapeutic exercises, 97530- Therapeutic activity, [id number]- Neuromuscular re-education, 97535- Self Care, [id number]- Manual therapy, and Patient/Family education.  PLAN FOR NEXT SESSION: HEP assessment and progression, symptom modulation, and loading (isolated and/or functional). Manual therapy, aerobic, gait, and NME training as needed. Core, LB, and glutes strengthening.     Washington Greener Shani Fitch  PT, DPT  03/03/2024, 10:59 AM  For all possible CPT codes, reference the Planned Interventions line above.     Check all conditions that are expected to impact treatment: {Conditions expected to impact treatment:Musculoskeletal disorders   If treatment provided at initial evaluation, no treatment charged due to lack of authorization.       "

## 2024-03-10 ENCOUNTER — Ambulatory Visit

## 2024-03-10 ENCOUNTER — Telehealth: Payer: Self-pay

## 2024-03-10 NOTE — Telephone Encounter (Signed)
 [place] Odessia Scot  PT, DPT
